# Patient Record
Sex: Male | Born: 1935 | Race: White | Hispanic: No | Marital: Married | Smoking: Former smoker
Health system: Southern US, Community
[De-identification: ages and names within clinical notes are randomized; demographics above are authoritative.]

## PROBLEM LIST (undated history)

## (undated) DIAGNOSIS — R609 Edema, unspecified: Secondary | ICD-10-CM

## (undated) DIAGNOSIS — J449 Chronic obstructive pulmonary disease, unspecified: Secondary | ICD-10-CM

## (undated) DIAGNOSIS — R011 Cardiac murmur, unspecified: Secondary | ICD-10-CM

## (undated) DIAGNOSIS — I10 Essential (primary) hypertension: Secondary | ICD-10-CM

## (undated) DIAGNOSIS — Z8673 Personal history of transient ischemic attack (TIA), and cerebral infarction without residual deficits: Secondary | ICD-10-CM

## (undated) DIAGNOSIS — I252 Old myocardial infarction: Secondary | ICD-10-CM

## (undated) DIAGNOSIS — IMO0002 Reserved for concepts with insufficient information to code with codable children: Secondary | ICD-10-CM

## (undated) DIAGNOSIS — E785 Hyperlipidemia, unspecified: Secondary | ICD-10-CM

## (undated) DIAGNOSIS — I2510 Atherosclerotic heart disease of native coronary artery without angina pectoris: Secondary | ICD-10-CM

## (undated) DIAGNOSIS — IMO0001 Reserved for inherently not codable concepts without codable children: Secondary | ICD-10-CM

## (undated) HISTORY — PX: HERNIA REPAIR: SHX51

## (undated) HISTORY — PX: VASECTOMY: SHX75

## (undated) HISTORY — PX: CORONARY ANGIOPLASTY WITH STENT PLACEMENT: SHX49

---

## 1999-03-16 DIAGNOSIS — I252 Old myocardial infarction: Secondary | ICD-10-CM

## 1999-03-16 HISTORY — DX: Old myocardial infarction: I25.2

## 2005-05-10 ENCOUNTER — Ambulatory Visit: Payer: Self-pay | Admitting: Cardiology

## 2005-06-15 ENCOUNTER — Ambulatory Visit: Payer: Self-pay | Admitting: Family Medicine

## 2006-02-04 ENCOUNTER — Other Ambulatory Visit: Payer: Self-pay

## 2006-02-04 ENCOUNTER — Inpatient Hospital Stay: Payer: Self-pay | Admitting: Internal Medicine

## 2006-10-14 ENCOUNTER — Ambulatory Visit: Payer: Self-pay | Admitting: Unknown Physician Specialty

## 2010-02-16 ENCOUNTER — Ambulatory Visit: Payer: Self-pay | Admitting: Unknown Physician Specialty

## 2010-06-27 ENCOUNTER — Ambulatory Visit: Payer: Self-pay | Admitting: Internal Medicine

## 2012-01-12 ENCOUNTER — Ambulatory Visit: Payer: Self-pay | Admitting: Internal Medicine

## 2012-02-03 ENCOUNTER — Ambulatory Visit: Payer: Self-pay | Admitting: Internal Medicine

## 2012-08-16 ENCOUNTER — Ambulatory Visit: Payer: Self-pay | Admitting: Neurology

## 2012-08-22 ENCOUNTER — Ambulatory Visit: Payer: Self-pay | Admitting: Cardiology

## 2013-10-29 ENCOUNTER — Ambulatory Visit: Payer: Self-pay | Admitting: Surgery

## 2013-10-29 DIAGNOSIS — I2510 Atherosclerotic heart disease of native coronary artery without angina pectoris: Secondary | ICD-10-CM

## 2013-10-29 LAB — CBC WITH DIFFERENTIAL/PLATELET
BASOS ABS: 0 10*3/uL (ref 0.0–0.1)
Basophil %: 0.5 %
Eosinophil #: 0.1 10*3/uL (ref 0.0–0.7)
Eosinophil %: 2 %
HCT: 46.5 % (ref 40.0–52.0)
HGB: 15 g/dL (ref 13.0–18.0)
LYMPHS ABS: 1.2 10*3/uL (ref 1.0–3.6)
LYMPHS PCT: 17.3 %
MCH: 31.9 pg (ref 26.0–34.0)
MCHC: 32.3 g/dL (ref 32.0–36.0)
MCV: 99 fL (ref 80–100)
Monocyte #: 0.6 x10 3/mm (ref 0.2–1.0)
Monocyte %: 8.5 %
Neutrophil #: 4.9 10*3/uL (ref 1.4–6.5)
Neutrophil %: 71.7 %
Platelet: 195 10*3/uL (ref 150–440)
RBC: 4.72 10*6/uL (ref 4.40–5.90)
RDW: 12.9 % (ref 11.5–14.5)
WBC: 6.9 10*3/uL (ref 3.8–10.6)

## 2013-10-29 LAB — BASIC METABOLIC PANEL
ANION GAP: 10 (ref 7–16)
BUN: 15 mg/dL (ref 7–18)
CHLORIDE: 102 mmol/L (ref 98–107)
Calcium, Total: 9.2 mg/dL (ref 8.5–10.1)
Co2: 31 mmol/L (ref 21–32)
Creatinine: 0.99 mg/dL (ref 0.60–1.30)
Glucose: 95 mg/dL (ref 65–99)
Osmolality: 286 (ref 275–301)
POTASSIUM: 3.8 mmol/L (ref 3.5–5.1)
Sodium: 143 mmol/L (ref 136–145)

## 2013-11-05 ENCOUNTER — Ambulatory Visit: Payer: Self-pay | Admitting: Surgery

## 2014-07-06 NOTE — Op Note (Signed)
PATIENT NAME:  Hunter Carey, Hunter Carey MR#:  [id number] DATE OF BIRTH:  [birth year]  DATE OF PROCEDURE:  11/05/2013  PREOPERATIVE DIAGNOSIS: Right inguinal hernia.   POSTOPERATIVE DIAGNOSIS: Right inguinal hernia.  OPERATION: Robotic-assisted right laparoscopic inguinal hernia repair.   ANESTHESIA: General.   SURGEON: Rodena Goldmann III, MD  OPERATIVE PROCEDURE: With the patient in the supine position, after induction of appropriate general anesthesia, the patient's abdomen was prepped with ChloraPrep and draped with sterile towels. The patient was placed in the head down, feet up position. A small supraumbilical incision was made in the standard fashion, carried down bluntly through subcutaneous tissue. A Veress needle was used to cannulate the peritoneal cavity. CO2 was insufflated to appropriate pressure measurements. When approximately 2.5 liters of CO2 were instilled, the Veress needle was withdrawn, and an 8.5 mm robotic port inserted in the supraumbilical area. Two lateral ports, 8 mm in size, were inserted under direct vision.   A right upper quadrant transverse incision was made and a 10 mm assistant port inserted under direct vision. The defect appeared to be a right direct hernia. The left side did not appear to be involved. The abdomen was briefly surveyed and no other evidence of injury or pathology was identified. The robot was brought to the table, appropriately docked in position. Instruments inserted under direct vision. I then moved to the console. The peritoneum was taken down from the medial umbilical fold laterally across the epigastric vessels. A large pocket was created in the preperitoneal space. Transversalis fascia was dissected free from the defect. The defect was quite small, so I elected to close primarily using V-Loc suture. Bard 3DMax mesh was brought to the table using a medium size piece, inserted in the preperitoneal space. It was sutured in place using 3-0 Vicryl. The peritoneum  was then placed back over the mesh, reapproximated using V-Loc suture. The repair appeared to be satisfactory. All ports were withdrawn without difficulty. The skin incisions were closed with 5-0 nylon. The area was infiltrated with 0.25% Marcaine for postoperative pain control. Sterile dressings were applied. The patient was returned to the recovery room, having tolerated the procedure well. Sponge and instrument count were correct x 2 in the operating room.     ____________________________ Micheline Maze, MD rle:LT D: 11/05/2013 11:42:00 ET T: 11/05/2013 17:58:51 ET JOB#: [id number]  cc: Micheline Maze, MD, <Dictator> Christena Flake. Raechel Ache, MD Rodena Goldmann MD ELECTRONICALLY SIGNED 11/08/2013 17:28

## 2014-11-23 ENCOUNTER — Observation Stay
Admission: EM | Admit: 2014-11-23 | Discharge: 2014-11-24 | Disposition: A | Discharge status: Home / Self Care | Payer: Medicare Other | Attending: Internal Medicine | Admitting: Internal Medicine

## 2014-11-23 ENCOUNTER — Emergency Department: Payer: Medicare Other

## 2014-11-23 DIAGNOSIS — Z87891 Personal history of nicotine dependence: Secondary | ICD-10-CM | POA: Insufficient documentation

## 2014-11-23 DIAGNOSIS — Z9852 Vasectomy status: Secondary | ICD-10-CM | POA: Diagnosis not present

## 2014-11-23 DIAGNOSIS — Z8673 Personal history of transient ischemic attack (TIA), and cerebral infarction without residual deficits: Secondary | ICD-10-CM | POA: Insufficient documentation

## 2014-11-23 DIAGNOSIS — E785 Hyperlipidemia, unspecified: Secondary | ICD-10-CM | POA: Insufficient documentation

## 2014-11-23 DIAGNOSIS — Z955 Presence of coronary angioplasty implant and graft: Secondary | ICD-10-CM | POA: Insufficient documentation

## 2014-11-23 DIAGNOSIS — Z9889 Other specified postprocedural states: Secondary | ICD-10-CM | POA: Diagnosis not present

## 2014-11-23 DIAGNOSIS — I10 Essential (primary) hypertension: Secondary | ICD-10-CM | POA: Insufficient documentation

## 2014-11-23 DIAGNOSIS — I252 Old myocardial infarction: Secondary | ICD-10-CM | POA: Diagnosis not present

## 2014-11-23 DIAGNOSIS — I2510 Atherosclerotic heart disease of native coronary artery without angina pectoris: Secondary | ICD-10-CM | POA: Diagnosis not present

## 2014-11-23 DIAGNOSIS — R079 Chest pain, unspecified: Secondary | ICD-10-CM | POA: Diagnosis not present

## 2014-11-23 DIAGNOSIS — J449 Chronic obstructive pulmonary disease, unspecified: Secondary | ICD-10-CM | POA: Insufficient documentation

## 2014-11-23 DIAGNOSIS — Z7982 Long term (current) use of aspirin: Secondary | ICD-10-CM | POA: Diagnosis not present

## 2014-11-23 HISTORY — DX: Old myocardial infarction: I25.2

## 2014-11-23 HISTORY — DX: Chronic obstructive pulmonary disease, unspecified: J44.9

## 2014-11-23 HISTORY — DX: Personal history of transient ischemic attack (TIA), and cerebral infarction without residual deficits: Z86.73

## 2014-11-23 HISTORY — DX: Atherosclerotic heart disease of native coronary artery without angina pectoris: I25.10

## 2014-11-23 HISTORY — DX: Reserved for concepts with insufficient information to code with codable children: IMO0002

## 2014-11-23 HISTORY — DX: Hyperlipidemia, unspecified: E78.5

## 2014-11-23 HISTORY — DX: Essential (primary) hypertension: I10

## 2014-11-23 LAB — TROPONIN I
Troponin I: <0.03 ng/mL (ref <0.031)
Troponin I: <0.03 ng/mL (ref <0.031)
Troponin I: <0.03 ng/mL (ref <0.031)

## 2014-11-23 LAB — CBC
HCT: 47.2 % (ref 40.0–52.0)
Hemoglobin: 15.8 g/dL (ref 13.0–18.0)
MCH: 32.1 pg (ref 26.0–34.0)
MCHC: 33.4 g/dL (ref 32.0–36.0)
MCV: 96.1 fL (ref 80.0–100.0)
PLATELETS: 176 10*3/uL (ref 150–440)
RBC: 4.92 MIL/uL (ref 4.40–5.90)
RDW: 13 % (ref 11.5–14.5)
WBC: 5.6 10*3/uL (ref 3.8–10.6)

## 2014-11-23 LAB — LIPASE, BLOOD: Lipase: 41 U/L (ref 22–51)

## 2014-11-23 LAB — TSH: TSH: 1.045 u[IU]/mL (ref 0.350–4.500)

## 2014-11-23 LAB — BASIC METABOLIC PANEL
Anion gap: 9 (ref 5–15)
BUN: 17 mg/dL (ref 6–20)
CHLORIDE: 101 mmol/L (ref 101–111)
CO2: 30 mmol/L (ref 22–32)
CREATININE: 0.94 mg/dL (ref 0.61–1.24)
Calcium: 10 mg/dL (ref 8.9–10.3)
GFR calc Af Amer: >60 mL/min (ref >60)
GFR calc non Af Amer: >60 mL/min (ref >60)
GLUCOSE: 105 mg/dL — AB (ref 65–99)
Potassium: 3.6 mmol/L (ref 3.5–5.1)
SODIUM: 140 mmol/L (ref 135–145)

## 2014-11-23 MED ORDER — MAGNESIUM OXIDE 400 (241.3 MG) MG PO TABS
400.0000 mg | ORAL_TABLET | Freq: Two times a day (BID) | ORAL | Status: DC
  Filled 2014-11-23: qty 1

## 2014-11-23 MED ORDER — SIMVASTATIN 10 MG PO TABS
10.0000 mg | ORAL_TABLET | Freq: Every day | ORAL | Status: DC
  Administered 2014-11-23: 10 mg via ORAL
  Filled 2014-11-23: qty 1

## 2014-11-23 MED ORDER — HYDROCHLOROTHIAZIDE 25 MG PO TABS
12.5000 mg | ORAL_TABLET | ORAL | Status: DC
  Administered 2014-11-24: 12.5 mg via ORAL
  Filled 2014-11-23: qty 1

## 2014-11-23 MED ORDER — NITROGLYCERIN 0.4 MG SL SUBL: 0.4000 mg | SUBLINGUAL_TABLET | SUBLINGUAL | Status: DC | PRN

## 2014-11-23 MED ORDER — METOPROLOL TARTRATE 25 MG PO TABS
12.5000 mg | ORAL_TABLET | Freq: Two times a day (BID) | ORAL | Status: DC
  Administered 2014-11-23 – 2014-11-24 (×2): 12.5 mg via ORAL
  Filled 2014-11-23 (×2): qty 1

## 2014-11-23 MED ORDER — RAMIPRIL 10 MG PO CAPS
10.0000 mg | ORAL_CAPSULE | Freq: Two times a day (BID) | ORAL | Status: DC
  Administered 2014-11-23 – 2014-11-24 (×2): 10 mg via ORAL
  Filled 2014-11-23 (×2): qty 1

## 2014-11-23 MED ORDER — ASPIRIN 81 MG PO CHEW
324.0000 mg | CHEWABLE_TABLET | Freq: Once | ORAL | Status: AC
  Administered 2014-11-23: 243 mg via ORAL
  Filled 2014-11-23: qty 4

## 2014-11-23 MED ORDER — ASPIRIN EC 81 MG PO TBEC
81.0000 mg | DELAYED_RELEASE_TABLET | Freq: Every day | ORAL | Status: DC
  Administered 2014-11-24: 81 mg via ORAL
  Filled 2014-11-23: qty 1

## 2014-11-23 MED ORDER — ONDANSETRON HCL 4 MG/2ML IJ SOLN: 4.0000 mg | Freq: Four times a day (QID) | INTRAMUSCULAR | Status: DC | PRN

## 2014-11-23 MED ORDER — ENOXAPARIN SODIUM 40 MG/0.4ML ~~LOC~~ SOLN
40.0000 mg | SUBCUTANEOUS | Status: DC
  Administered 2014-11-23: 40 mg via SUBCUTANEOUS
  Filled 2014-11-23: qty 0.4

## 2014-11-23 MED ORDER — ONDANSETRON HCL 4 MG PO TABS
4.0000 mg | ORAL_TABLET | Freq: Four times a day (QID) | ORAL | Status: DC | PRN
Start: 2014-11-23 — End: 2014-11-24

## 2014-11-23 MED ORDER — ACETAMINOPHEN 650 MG RE SUPP: 650.0000 mg | Freq: Four times a day (QID) | RECTAL | Status: DC | PRN

## 2014-11-23 MED ORDER — ACETAMINOPHEN 325 MG PO TABS: 650.0000 mg | ORAL_TABLET | Freq: Four times a day (QID) | ORAL | Status: DC | PRN

## 2014-11-23 NOTE — ED Notes (Signed)
Patient resting comfortably. Wife at bedside. Aspirin given. Denies any pain at this time.

## 2014-11-23 NOTE — H&P (Signed)
[facility] NAME: Hunter Carey    MR#:  [id number]  DATE OF BIRTH:  [birth year]  DATE OF ADMISSION:  11/23/2014  PRIMARY CARE PHYSICIAN:  Dr. Raechel Ache  REQUESTING/REFERRING PHYSICIAN: Delman Kitten, M.D  CHIEF COMPLAINT:   Chief Complaint  Patient presents with  . Chest Pain    HISTORY OF PRESENT ILLNESS: Hunter Carey  is a 79 y.o. male with a known history of CAD and MI with stent placement who presents with the emergency room with complaint of having chest pain since yesterday. Patient reports that he started having left-sided discomfort similar to his heart attack in 2001. It's been coming and going. Sometimes it'll last for an hour sometimes 4 minutes. He did not have any shortness of breath no radiation of the pain. Denies any shortness of breath no nausea or vomiting. The emergency room physician did speak to on call cardiologist who recommended patient be observed overnight.  PAST MEDICAL HISTORY:   Past Medical History  Diagnosis Date  . Hypertension   . Hyperlipemia   . H/O TIA (transient ischemic attack) and stroke   . COPD (chronic obstructive pulmonary disease)   . CAD (coronary artery disease)   . MI, old 2001    lad stent  . Compression fracture h/o    PAST SURGICAL HISTORY:  Past Surgical History  Procedure Laterality Date  . Coronary angioplasty with stent placement    . Hernia repair    . Vasectomy      SOCIAL HISTORY:  Social History  Substance Use Topics  . Smoking status: Former Research scientist (life sciences)  . Smokeless tobacco: Never Used  . Alcohol Use: 0.6 oz/week    1 Standard drinks or equivalent per week    FAMILY HISTORY:  Family History  Problem Relation Age of Onset  . Hypertension Mother     DRUG ALLERGIES: No Known Allergies  REVIEW OF SYSTEMS:   CONSTITUTIONAL: No fever, fatigue or weakness.  EYES: No blurred or double vision.  EARS, NOSE, AND THROAT: No tinnitus or ear pain.  RESPIRATORY:  No cough, shortness of breath, wheezing or hemoptysis.  CARDIOVASCULAR: Positive chest pain, orthopnea, edema.  GASTROINTESTINAL: No nausea, vomiting, diarrhea or abdominal pain.  GENITOURINARY: No dysuria, hematuria.  ENDOCRINE: No polyuria, nocturia,  HEMATOLOGY: No anemia, easy bruising or bleeding SKIN: No rash or lesion. MUSCULOSKELETAL: No joint pain or arthritis.   NEUROLOGIC: No tingling, numbness, weakness.  PSYCHIATRY: No anxiety or depression.   MEDICATIONS AT HOME:  Prior to Admission medications   Medication Sig Start Date End Date Taking? Authorizing Provider  aspirin EC 81 MG tablet Take 81 mg by mouth daily.   Yes Historical Provider, MD  hydrochlorothiazide (HYDRODIURIL) 25 MG tablet Take 12.5 mg by mouth every morning. 11/22/14  Yes Historical Provider, MD  magnesium oxide (MAG-OX) 400 (241.3 MG) MG tablet Take 400 mg by mouth daily as needed. For cramps. 08/21/14  Yes Historical Provider, MD  metoprolol tartrate (LOPRESSOR) 25 MG tablet Take 12.5 mg by mouth 2 (two) times daily. 11/22/14  Yes Historical Provider, MD  nitroGLYCERIN (NITROSTAT) 0.4 MG SL tablet Place 0.4 mg under the tongue every 5 (five) minutes x 3 doses as needed. For chest pain. If no relief call MD or go to emergency room.   Yes Historical Provider, MD  ramipril (ALTACE) 10 MG capsule Take 10 mg by mouth 2 (two) times daily. 11/22/14  Yes Historical Provider, MD  simvastatin (ZOCOR) 10 MG tablet  Take 10 mg by mouth at bedtime. 11/22/14  Yes Historical Provider, MD      PHYSICAL EXAMINATION:   VITAL SIGNS: Blood pressure 124/67, pulse 43, temperature 97.6 F (36.4 C), temperature source Oral, resp. rate 18, height 5\' 8"  (1.727 m), weight 66.679 kg (147 lb), SpO2 96 %.  GENERAL:  79 y.o.-year-old patient lying in the bed with no acute distress.  EYES: Pupils equal, round, reactive to light and accommodation. No scleral icterus. Extraocular muscles intact.  HEENT: Head atraumatic, normocephalic. Oropharynx and  nasopharynx clear.  NECK:  Supple, no jugular venous distention. No thyroid enlargement, no tenderness.  LUNGS: Normal breath sounds bilaterally, no wheezing, rales,rhonchi or crepitation. No use of accessory muscles of respiration.  CARDIOVASCULAR: S1, S2 normal. No murmurs, rubs, or gallops.  ABDOMEN: Soft, nontender, nondistended. Bowel sounds present. No organomegaly or mass.  EXTREMITIES: No pedal edema, cyanosis, or clubbing.  NEUROLOGIC: Cranial nerves II through XII are intact. Muscle strength 5/5 in all extremities. Sensation intact. Gait not checked.  PSYCHIATRIC: The patient is alert and oriented x 3.  SKIN: No obvious rash, lesion, or ulcer.   LABORATORY PANEL:   CBC  Recent Labs Lab 11/23/14 1042  WBC 5.6  HGB 15.8  HCT 47.2  PLT 176  MCV 96.1  MCH 32.1  MCHC 33.4  RDW 13.0   ------------------------------------------------------------------------------------------------------------------  Chemistries   Recent Labs Lab 11/23/14 1042  NA 140  K 3.6  CL 101  CO2 30  GLUCOSE 105*  BUN 17  CREATININE 0.94  CALCIUM 10.0   ------------------------------------------------------------------------------------------------------------------ estimated creatinine clearance is 63.1 mL/min (by C-G formula based on Cr of 0.94). ------------------------------------------------------------------------------------------------------------------ No results for input(s): TSH, T4TOTAL, T3FREE, THYROIDAB in the last 72 hours.  Invalid input(s): FREET3   Coagulation profile No results for input(s): INR, PROTIME in the last 168 hours. ------------------------------------------------------------------------------------------------------------------- No results for input(s): DDIMER in the last 72 hours. -------------------------------------------------------------------------------------------------------------------  Cardiac Enzymes  Recent Labs Lab 11/23/14 1042   TROPONINI <0.03   ------------------------------------------------------------------------------------------------------------------ Invalid input(s): POCBNP  ---------------------------------------------------------------------------------------------------------------  Urinalysis No results found for: COLORURINE, APPEARANCEUR, LABSPEC, PHURINE, GLUCOSEU, HGBUR, BILIRUBINUR, KETONESUR, PROTEINUR, UROBILINOGEN, NITRITE, LEUKOCYTESUR   RADIOLOGY: Dg Chest 2 View  11/23/2014   CLINICAL DATA:  Discomfort in the chest since last night.  EXAM: CHEST  2 VIEW  COMPARISON:  Chest CT 02/04/2006. Prior chest radiographs on available for review.  FINDINGS: Chronic hyperinflation and mild bronchitic markings. Minimal atelectatic or scar like opacities at the bases. There is no edema, consolidation, effusion, or pneumothorax. Normal heart size and negative aortic contours.  IMPRESSION: COPD changes without acute superimposed finding.   Electronically Signed   By: Monte Fantasia M.D.   On: 11/23/2014 10:41    EKG: Orders placed or performed during the hospital encounter of 11/23/14  . EKG 12-Lead  . EKG 12-Lead  . ED EKG within 10 minutes  . ED EKG within 10 minutes    IMPRESSION AND PLAN: Patient is a 79 year old white male with chest pain 1. Chest pain: At this time will monitor him overnight for observation. Serial cardiac enzymes continue aspirin. Have cardiology evaluate the patient.  2. Hypertension continue hydrochlorothiazide and metoprolol and ramimpril  3, hyperlipidemia continue simvastatin check a fasting lipid panel tomorrow morning  4, miscellaneous we'll do Lovenox for DVT prophylaxis    All the records are reviewed and case discussed with ED provider. Management plans discussed with the patient, family and they are in agreement.  CODE STATUS:    TOTAL TIME TAKING  CARE OF THIS PATIENT: 22min .    Dustin Flock M.D on 11/23/2014 at 11:57 AM  Between 7am to 6pm -  Pager - [phone]  After 6pm go to [email or web address] - password [email or web address] [facility]  [facility]  Office  [phone]  CC: Primary care physician; No primary care provider on file.

## 2014-11-23 NOTE — ED Provider Notes (Signed)
[facility] Emergency Department Provider Note REMINDER - THIS NOTE IS NOT A FINAL MEDICAL RECORD UNTIL IT IS SIGNED. UNTIL THEN, THE CONTENT BELOW MAY REFLECT INFORMATION FROM A DOCUMENTATION TEMPLATE, NOT THE ACTUAL PATIENT VISIT. ____________________________________________  Time seen: Approximately 10:23 AM  I have reviewed the triage vital signs and the nursing notes.   HISTORY  Chief Complaint Chest Pain    HPI Hunter Carey is a 79 y.o. male who reports having discomfort in the left chest which she describes as somewhat hard to describe that feels like a pressure over the left side of the chest. He reports he comes today because this feels just like the symptoms he had prior to his heart attack 15 years ago.  Patient does have a history of coronary artery disease and stents. Is currently on aspirin. He is under the care Dr. Josefa Half.  Denies abdominal pain. No trouble breathing. Fevers or chills. Pain is nonradiating.No notable change with exertion Past Medical History  Diagnosis Date  . Hypertension   . Hyperlipemia     There are no active problems to display for this patient.   Past Surgical History  Procedure Laterality Date  . Coronary angioplasty with stent placement    . Hernia repair      Current Outpatient Rx  Name  Route  Sig  Dispense  Refill  . aspirin EC 81 MG tablet   Oral   Take 81 mg by mouth daily.         . hydrochlorothiazide (HYDRODIURIL) 25 MG tablet   Oral   Take 12.5 mg by mouth every morning.         . magnesium oxide (MAG-OX) 400 (241.3 MG) MG tablet   Oral   Take 400 mg by mouth daily as needed. For cramps.      3   . metoprolol tartrate (LOPRESSOR) 25 MG tablet   Oral   Take 12.5 mg by mouth 2 (two) times daily.         . nitroGLYCERIN (NITROSTAT) 0.4 MG SL tablet   Sublingual   Place 0.4 mg under the tongue every 5 (five) minutes x 3 doses as needed. For chest pain. If no relief call MD or go  to emergency room.         . ramipril (ALTACE) 10 MG capsule   Oral   Take 10 mg by mouth 2 (two) times daily.         . simvastatin (ZOCOR) 10 MG tablet   Oral   Take 10 mg by mouth at bedtime.           Allergies Review of patient's allergies indicates no known allergies.  No family history on file.  Social History Social History  Substance Use Topics  . Smoking status: Former Research scientist (life sciences)  . Smokeless tobacco: Never Used  . Alcohol Use: Yes   strong family history coronary disease  Review of Systems Constitutional: No fever/chills Eyes: No visual changes. ENT: No sore throat. Cardiovascular: See history of present illness Respiratory: Denies shortness of breath. Gastrointestinal: No abdominal pain.  No nausea, no vomiting.  No diarrhea.  No constipation. Genitourinary: Negative for dysuria. Musculoskeletal: Negative for back pain. Skin: Negative for rash. Neurological: Negative for headaches, focal weakness or numbness.  10-point ROS otherwise negative.  ____________________________________________   PHYSICAL EXAM:  VITAL SIGNS: ED Triage Vitals  Enc Vitals Group     BP 11/23/14 0953 134/60 mmHg     Pulse Rate 11/23/14  0953 48     Resp 11/23/14 0953 16     Temp 11/23/14 0953 97.6 F (36.4 C)     Temp Source 11/23/14 0953 Oral     SpO2 11/23/14 0953 97 %     Weight 11/23/14 0953 147 lb (66.679 kg)     Height 11/23/14 0953 5\' 8"  (1.727 m)     Head Cir --      Peak Flow --      Pain Score 11/23/14 0954 1     Pain Loc --      Pain Edu? --      Excl. in [place]? --    Constitutional: Alert and oriented. Well appearing and in no acute distress. Eyes: Conjunctivae are normal. PERRL. EOMI. Head: Atraumatic. Nose: No congestion/rhinnorhea. Mouth/Throat: Mucous membranes are moist.  Oropharynx non-erythematous. Neck: No stridor.   Cardiovascular: Normal rate, regular rhythm. Grossly normal heart sounds.  Good peripheral circulation. Respiratory: Normal  respiratory effort.  No retractions. Lungs CTAB. Gastrointestinal: Soft and nontender. No distention. No abdominal bruits. No CVA tenderness. Musculoskeletal: No lower extremity tenderness nor edema.  No joint effusions. Neurologic:  Normal speech and language. No gross focal neurologic deficits are appreciated.  Skin:  Skin is warm, dry and intact. No rash noted. Psychiatric: Mood and affect are normal. Speech and behavior are normal.  ____________________________________________   LABS (all labs ordered are listed, but only abnormal results are displayed)  Labs Reviewed  BASIC METABOLIC PANEL - Abnormal; Notable for the following:    Glucose, Bld 105 (*)    All other components within normal limits  CBC  TROPONIN I  LIPASE, BLOOD   ____________________________________________  EKG  Reviewed and interpreted by me EKG time 9:53 AM Normal sinus rhythm There is T-wave inversion noted in V2 which is new compared with previous from 10/29/2013 Is no ST elevation Normal intervals Reviewed and interpreted as normal sinus rhythm but with potentially concerning EKG changes noted in V2 only without ST elevation. ____________________________________________  JOACZYSAY  DG Chest 2 View (Final result) Result time: 11/23/14 10:41:36   Final result by Rad Results In Interface (11/23/14 10:41:36)   Narrative:   CLINICAL DATA: Discomfort in the chest since last night.  EXAM: CHEST 2 VIEW  COMPARISON: Chest CT 02/04/2006. Prior chest radiographs on available for review.  FINDINGS: Chronic hyperinflation and mild bronchitic markings. Minimal atelectatic or scar like opacities at the bases. There is no edema, consolidation, effusion, or pneumothorax. Normal heart size and negative aortic contours.  IMPRESSION: COPD changes without acute superimposed finding.    ____________________________________________   PROCEDURES  Procedure(s) performed: None  Critical Care  performed: No  ____________________________________________   INITIAL IMPRESSION / ASSESSMENT AND PLAN / ED COURSE  Pertinent labs & imaging results that were available during my care of the patient were reviewed by me and considered in my medical decision making (see chart for details).  Chest pain. History of previous MI with similar symptoms. No abdominal pain. Symptoms and EKG changes concerning for the possibility of acute coronary syndrome, no evidence of acute ST elevation MI. Sent troponin, chest x-ray and standard chest pain evaluation. Anticipate admission.  ----------------------------------------- 11:40 AM on 11/23/2014 -----------------------------------------  Discussed case and care with Dr. Ubaldo Glassing. I also discussed with the patient, the patient has agreed with plan for overnight observation regarding ongoing chest discomfort. Thus far no evidence of acute coronary syndrome based on biomarkers. ____________________________________________   FINAL CLINICAL IMPRESSION(S) / ED DIAGNOSES  Final diagnoses:  Chest  pain, moderate coronary artery risk      Delman Kitten, MD 11/23/14 1140

## 2014-11-23 NOTE — ED Notes (Signed)
Pt c/o substernal to left sided chest pain/epigastric pain that started yesterday around 12pm with SOB.Marland Kitchenstates it feels the same as last MI.Marland Kitchen

## 2014-11-24 LAB — LIPID PANEL
Cholesterol: 114 mg/dL (ref 0–200)
HDL: 45 mg/dL (ref >40)
LDL CALC: 59 mg/dL (ref 0–99)
Total CHOL/HDL Ratio: 2.5 RATIO
Triglycerides: 50 mg/dL (ref <150)
VLDL: 10 mg/dL (ref 0–40)

## 2014-11-24 LAB — TROPONIN I

## 2014-11-24 NOTE — Discharge Summary (Signed)
[facility] NAME: Hunter Carey    MR#:  [id number]  DATE OF BIRTH:  Jan 15, 1936  DATE OF ADMISSION:  11/23/2014 ADMITTING PHYSICIAN: Dustin Flock, MD  DATE OF DISCHARGE: 11/24/2014  PRIMARY CARE PHYSICIAN: No primary care provider on file.    ADMISSION DIAGNOSIS:  Chest pain, moderate coronary artery risk [R07.9]  DISCHARGE DIAGNOSIS:  Active Problems:   Chest pain   Seen by cardiologist in hospital, troponin negative.  SECONDARY DIAGNOSIS:   Past Medical History  Diagnosis Date  . Hypertension   . Hyperlipemia   . H/O TIA (transient ischemic attack) and stroke   . COPD (chronic obstructive pulmonary disease)   . CAD (coronary artery disease)   . MI, old 2001    lad stent  . Compression fracture h/o    HOSPITAL COURSE:  Patient is a 79 year old white male with chest pain 1. Chest pain: telemetry monitor overnight for observation. Serial cardiac enzymes negative,continue aspirin. Cardiologist Dr. Ubaldo Glassing seen the pt in hospital, follow in office with Dr. Saralyn Pilar in 1 week.  2. Hypertension continue hydrochlorothiazide and metoprolol and ramimpril  3, hyperlipidemia continue simvastatin check a fasting lipid panel tomorrow morning  4, miscellaneous we'll do Lovenox for DVT prophylaxis  DISCHARGE CONDITIONS:   Stable.  CONSULTS OBTAINED:  Treatment Team:  Dustin Flock, MD Teodoro Spray, MD  DRUG ALLERGIES:  No Known Allergies  DISCHARGE MEDICATIONS:   Current Discharge Medication List    CONTINUE these medications which have NOT CHANGED   Details  aspirin EC 81 MG tablet Take 81 mg by mouth daily.    hydrochlorothiazide (HYDRODIURIL) 25 MG tablet Take 12.5 mg by mouth every morning.    magnesium oxide (MAG-OX) 400 (241.3 MG) MG tablet Take 400 mg by mouth daily as needed. For cramps. Refills: 3    metoprolol tartrate (LOPRESSOR) 25 MG tablet Take 12.5 mg by mouth 2 (two) times daily.     nitroGLYCERIN (NITROSTAT) 0.4 MG SL tablet Place 0.4 mg under the tongue every 5 (five) minutes x 3 doses as needed. For chest pain. If no relief call MD or go to emergency room.    ramipril (ALTACE) 10 MG capsule Take 10 mg by mouth 2 (two) times daily.    simvastatin (ZOCOR) 10 MG tablet Take 10 mg by mouth at bedtime.         DISCHARGE INSTRUCTIONS:    Follow in cardiology office in 1 week.  If you experience worsening of your admission symptoms, develop shortness of breath, life threatening emergency, suicidal or homicidal thoughts you must seek medical attention immediately by calling 911 or calling your MD immediately  if symptoms less severe.  You Must read complete instructions/literature along with all the possible adverse reactions/side effects for all the Medicines you take and that have been prescribed to you. Take any new Medicines after you have completely understood and accept all the possible adverse reactions/side effects.   Please note  You were cared for by a hospitalist during your hospital stay. If you have any questions about your discharge medications or the care you received while you were in the hospital after you are discharged, you can call the unit and asked to speak with the hospitalist on call if the hospitalist that took care of you is not available. Once you are discharged, your primary care physician will handle any further medical issues. Please note that NO REFILLS for any discharge medications will be authorized  once you are discharged, as it is imperative that you return to your primary care physician (or establish a relationship with a primary care physician if you do not have one) for your aftercare needs so that they can reassess your need for medications and monitor your lab values.    Today   CHIEF COMPLAINT:   Chief Complaint  Patient presents with  . Chest Pain    HISTORY OF PRESENT ILLNESS:  Hunter Carey  is a 79 y.o. male with a known  history of CAD and MI with stent placement who presents with the emergency room with complaint of having chest pain since yesterday. Patient reports that he started having left-sided discomfort similar to his heart attack in 2001. It's been coming and going. Sometimes it'll last for an hour sometimes 4 minutes. He did not have any shortness of breath no radiation of the pain. Denies any shortness of breath no nausea or vomiting. The emergency room physician did speak to on call cardiologist who recommended patient be observed overnight.   VITAL SIGNS:  Blood pressure 117/61, pulse 50, temperature 97.7 F (36.5 C), temperature source Oral, resp. rate 18, height 5\' 8"  (1.727 m), weight 66.679 kg (147 lb), SpO2 96 %.  I/O:   Intake/Output Summary (Last 24 hours) at 11/24/14 1052 Last data filed at 11/24/14 0925  Gross per 24 hour  Intake      0 ml  Output   1200 ml  Net  -1200 ml    PHYSICAL EXAMINATION:   GENERAL: 79 y.o.-year-old patient lying in the bed with no acute distress.  EYES: Pupils equal, round, reactive to light and accommodation. No scleral icterus. Extraocular muscles intact.  HEENT: Head atraumatic, normocephalic. Oropharynx and nasopharynx clear.  NECK: Supple, no jugular venous distention. No thyroid enlargement, no tenderness.  LUNGS: Normal breath sounds bilaterally, no wheezing, rales,rhonchi or crepitation. No use of accessory muscles of respiration.  CARDIOVASCULAR: S1, S2 normal. No murmurs, rubs, or gallops.  ABDOMEN: Soft, nontender, nondistended. Bowel sounds present. No organomegaly or mass.  EXTREMITIES: No pedal edema, cyanosis, or clubbing.  NEUROLOGIC: Cranial nerves II through XII are intact. Muscle strength 5/5 in all extremities. Sensation intact. Gait not checked.  PSYCHIATRIC: The patient is alert and oriented x 3.  SKIN: No obvious rash, lesion, or ulcer.   DATA REVIEW:   CBC  Recent Labs Lab 11/23/14 1042  WBC 5.6  HGB 15.8  HCT  47.2  PLT 176    Chemistries   Recent Labs Lab 11/23/14 1042  NA 140  K 3.6  CL 101  CO2 30  GLUCOSE 105*  BUN 17  CREATININE 0.94  CALCIUM 10.0    Cardiac Enzymes  Recent Labs Lab 11/24/14 0304  TROPONINI <0.03    Microbiology Results  No results found for this or any previous visit.  RADIOLOGY:  Dg Chest 2 View  11/23/2014   CLINICAL DATA:  Discomfort in the chest since last night.  EXAM: CHEST  2 VIEW  COMPARISON:  Chest CT 02/04/2006. Prior chest radiographs on available for review.  FINDINGS: Chronic hyperinflation and mild bronchitic markings. Minimal atelectatic or scar like opacities at the bases. There is no edema, consolidation, effusion, or pneumothorax. Normal heart size and negative aortic contours.  IMPRESSION: COPD changes without acute superimposed finding.   Electronically Signed   By: Monte Fantasia M.D.   On: 11/23/2014 10:41     Management plans discussed with the patient, family and they are in agreement.  CODE STATUS:     Code Status Orders        Start     Ordered   11/23/14 1258  Full code   Continuous     11/23/14 1257      TOTAL TIME TAKING CARE OF THIS PATIENT: 35 minutes.    Vaughan Basta M.D on 11/24/2014 at 10:52 AM  Between 7am to 6pm - Pager - [phone]  After 6pm go to [email or web address] - password [email or web address] [facility]  [facility]  Office  [phone]  CC: Primary care physician; No primary care provider on file.

## 2014-11-24 NOTE — Consult Note (Signed)
[facility]  CARDIOLOGY CONSULT NOTE  Patient ID: Hunter Carey MRN: [id number] DOB/AGE: 1935-08-31 79 y.o.  Admit date: 11/23/2014 Referring Physician [facility] Primary Physician   Primary Cardiologist Paraschos Reason for Consultation Chest pain  HPI: Pt is a 79 yo male with history of cad s/p pci in the early 2000's in the lad distribution, with relook cardiac cath in 2014 revealing patent stents.  He has been doing well until Friday of this week when he began feeling intermittent left chest pain and epigastric pain which he states was similar to his angina. He had no signficant changes on his ekg and has ruled out for mi. No further chest pain. Has been compliant with his medications. Pain was non exertional and lasted anywhere from a few minutes to aproximately 1 hour.   ROS Review of Systems - History obtained from chart review and the patient General ROS: positive for  - chest pain Respiratory ROS: no cough, shortness of breath, or wheezing Cardiovascular ROS: positive for - chest pain Gastrointestinal ROS: no abdominal pain, change in bowel habits, or black or bloody stools Musculoskeletal ROS: negative Neurological ROS: no TIA or stroke symptoms   Past Medical History  Diagnosis Date  . Hypertension   . Hyperlipemia   . H/O TIA (transient ischemic attack) and stroke   . COPD (chronic obstructive pulmonary disease)   . CAD (coronary artery disease)   . MI, old 2001    lad stent  . Compression fracture h/o    Family History  Problem Relation Age of Onset  . Hypertension Mother     Social History   Social History  . Marital Status: Married    Spouse Name: N/A  . Number of Children: N/A  . Years of Education: N/A   Occupational History  . Not on file.   Social History Main Topics  . Smoking status: Former Research scientist (life sciences)  . Smokeless tobacco: Never Used  . Alcohol Use: 0.6 oz/week    1 Standard drinks or equivalent per week  .  Drug Use: No  . Sexual Activity: Yes   Other Topics Concern  . Not on file   Social History Narrative  . No narrative on file    Past Surgical History  Procedure Laterality Date  . Coronary angioplasty with stent placement    . Hernia repair    . Vasectomy       Prescriptions prior to admission  Medication Sig Dispense Refill Last Dose  . aspirin EC 81 MG tablet Take 81 mg by mouth daily.   11/23/2014 at Unknown time  . hydrochlorothiazide (HYDRODIURIL) 25 MG tablet Take 12.5 mg by mouth every morning.   11/23/2014 at Unknown time  . magnesium oxide (MAG-OX) 400 (241.3 MG) MG tablet Take 400 mg by mouth daily as needed. For cramps.  3 Past Month at Unknown time  . metoprolol tartrate (LOPRESSOR) 25 MG tablet Take 12.5 mg by mouth 2 (two) times daily.   11/23/2014 at 0530  . nitroGLYCERIN (NITROSTAT) 0.4 MG SL tablet Place 0.4 mg under the tongue every 5 (five) minutes x 3 doses as needed. For chest pain. If no relief call MD or go to emergency room.   prn  . ramipril (ALTACE) 10 MG capsule Take 10 mg by mouth 2 (two) times daily.   11/23/2014 at Unknown time  . simvastatin (ZOCOR) 10 MG tablet Take 10 mg by mouth at bedtime.   11/22/2014 at Unknown time  Physical Exam: Blood pressure 117/61, pulse 50, temperature 97.7 F (36.5 C), temperature source Oral, resp. rate 18, height 5\' 8"  (1.727 m), weight 66.679 kg (147 lb), SpO2 96 %.    General appearance: alert and cooperative Head: Normocephalic, without obvious abnormality, atraumatic Resp: clear to auscultation bilaterally Chest wall: no tenderness Cardio: regular rate and rhythm, S1, S2 normal, no murmur, click, rub or gallop GI: soft, non-tender; bowel sounds normal; no masses,  no organomegaly Extremities: extremities normal, atraumatic, no cyanosis or edema Pulses: 2+ and symmetric Neurologic: Grossly normal Labs:   Lab Results  Component Value Date   WBC 5.6 11/23/2014   HGB 15.8 11/23/2014   HCT 47.2 11/23/2014   MCV  96.1 11/23/2014   PLT 176 11/23/2014    Recent Labs Lab 11/23/14 1042  NA 140  K 3.6  CL 101  CO2 30  BUN 17  CREATININE 0.94  CALCIUM 10.0  GLUCOSE 105*   Lab Results  Component Value Date   TROPONINI <0.03 11/24/2014      Radiology: no airspace disease EKG: nsr with nonspecific st t wave changes.   ASSESSMENT AND PLAN:  79 yo male with history of lad disease s/p pci in early 2000's and had patent stent by relook cath in 2014 admitted with chest pain with both typical and atypical features from cardiac stanpoint. He has ruled out for an mi and is pain free at present. Symptoms due not appear to be due to unstable acs. WOuld discharge on current meds including asa, metoprolol 12.5 mg bid, altace 10 mg daily, zocor 10 mg daily. Follow up with Dr. Saralyn Pilar early next week for consideration for functional tudy.  OK for discharge today.  Signed: Teodoro Spray MD, [facility] 11/24/2014, 11:07 AM

## 2014-11-24 NOTE — Progress Notes (Signed)
Pt in NAD, skin warm and dry, VSS, SR per monitor.  Pt denies any pain or discomfort.  Disharge instructions given to and reviewed with pt, verbalized understanding.  Pt discharged home.

## 2015-03-19 ENCOUNTER — Encounter: Payer: Self-pay | Admitting: *Deleted

## 2015-03-23 NOTE — H&P (Signed)
See scanned H&P

## 2015-03-24 ENCOUNTER — Encounter
Admission: RE | Disposition: A | Discharge status: Home / Self Care | Payer: Self-pay | Source: Ambulatory Visit | Attending: Ophthalmology

## 2015-03-24 ENCOUNTER — Ambulatory Visit: Payer: Medicare Other | Admitting: Certified Registered"

## 2015-03-24 ENCOUNTER — Ambulatory Visit
Admission: RE | Admit: 2015-03-24 | Discharge: 2015-03-24 | Disposition: A | Discharge status: Home / Self Care | Payer: Medicare Other | Source: Ambulatory Visit | Attending: Ophthalmology | Admitting: Ophthalmology

## 2015-03-24 ENCOUNTER — Encounter: Payer: Self-pay | Admitting: *Deleted

## 2015-03-24 DIAGNOSIS — E7800 Pure hypercholesterolemia, unspecified: Secondary | ICD-10-CM | POA: Diagnosis not present

## 2015-03-24 DIAGNOSIS — Z87891 Personal history of nicotine dependence: Secondary | ICD-10-CM | POA: Diagnosis not present

## 2015-03-24 DIAGNOSIS — H269 Unspecified cataract: Secondary | ICD-10-CM | POA: Diagnosis present

## 2015-03-24 DIAGNOSIS — Z9889 Other specified postprocedural states: Secondary | ICD-10-CM | POA: Diagnosis not present

## 2015-03-24 DIAGNOSIS — H2512 Age-related nuclear cataract, left eye: Secondary | ICD-10-CM | POA: Diagnosis not present

## 2015-03-24 DIAGNOSIS — Z7982 Long term (current) use of aspirin: Secondary | ICD-10-CM | POA: Diagnosis not present

## 2015-03-24 DIAGNOSIS — Z79899 Other long term (current) drug therapy: Secondary | ICD-10-CM | POA: Diagnosis not present

## 2015-03-24 DIAGNOSIS — Z955 Presence of coronary angioplasty implant and graft: Secondary | ICD-10-CM | POA: Diagnosis not present

## 2015-03-24 HISTORY — PX: CATARACT EXTRACTION W/PHACO: SHX586

## 2015-03-24 HISTORY — DX: Cardiac murmur, unspecified: R01.1

## 2015-03-24 HISTORY — DX: Edema, unspecified: R60.9

## 2015-03-24 HISTORY — DX: Reserved for inherently not codable concepts without codable children: IMO0001

## 2015-03-24 SURGERY — PHACOEMULSIFICATION, CATARACT, WITH IOL INSERTION
Anesthesia: Monitor Anesthesia Care | Site: Eye | Laterality: Left | Wound class: Clean

## 2015-03-24 MED ORDER — ALFENTANIL 500 MCG/ML IJ INJ
INJECTION | INTRAMUSCULAR | Status: DC | PRN
  Administered 2015-03-24: 500 ug via INTRAVENOUS

## 2015-03-24 MED ORDER — EPINEPHRINE HCL 1 MG/ML IJ SOLN
INTRAMUSCULAR | Status: AC
  Filled 2015-03-24: qty 1

## 2015-03-24 MED ORDER — CARBACHOL 0.01 % IO SOLN
INTRAOCULAR | Status: DC | PRN
  Administered 2015-03-24: .5 mL via INTRAOCULAR

## 2015-03-24 MED ORDER — SODIUM CHLORIDE 0.9 % IV SOLN
INTRAVENOUS | Status: DC
  Administered 2015-03-24: 10:00:00 via INTRAVENOUS

## 2015-03-24 MED ORDER — CEFUROXIME OPHTHALMIC INJECTION 1 MG/0.1 ML
INJECTION | OPHTHALMIC | Status: AC
  Filled 2015-03-24: qty 0.1

## 2015-03-24 MED ORDER — TETRACAINE HCL 0.5 % OP SOLN
OPHTHALMIC | Status: AC
  Filled 2015-03-24: qty 2

## 2015-03-24 MED ORDER — MIDAZOLAM HCL 2 MG/2ML IJ SOLN
INTRAMUSCULAR | Status: DC | PRN
Start: 2015-03-24 — End: 2015-03-24
  Administered 2015-03-24: 1 mg via INTRAVENOUS

## 2015-03-24 MED ORDER — EPINEPHRINE HCL 1 MG/ML IJ SOLN
INTRAOCULAR | Status: DC | PRN
  Administered 2015-03-24: 1 mL via OPHTHALMIC

## 2015-03-24 MED ORDER — MOXIFLOXACIN HCL 0.5 % OP SOLN
OPHTHALMIC | Status: DC | PRN
  Administered 2015-03-24: 1 [drp] via OPHTHALMIC

## 2015-03-24 MED ORDER — CYCLOPENTOLATE HCL 2 % OP SOLN
OPHTHALMIC | Status: AC
  Administered 2015-03-24: 1 [drp] via OPHTHALMIC
  Filled 2015-03-24: qty 2

## 2015-03-24 MED ORDER — CEFUROXIME OPHTHALMIC INJECTION 1 MG/0.1 ML
INJECTION | OPHTHALMIC | Status: DC | PRN
  Administered 2015-03-24: .1 mL via INTRACAMERAL

## 2015-03-24 MED ORDER — LIDOCAINE HCL (PF) 4 % IJ SOLN
INTRAMUSCULAR | Status: AC
  Filled 2015-03-24: qty 5

## 2015-03-24 MED ORDER — LIDOCAINE HCL (PF) 4 % IJ SOLN
INTRAOCULAR | Status: DC | PRN
  Administered 2015-03-24: .5 mL via OPHTHALMIC

## 2015-03-24 MED ORDER — MOXIFLOXACIN HCL 0.5 % OP SOLN
1.0000 [drp] | OPHTHALMIC | Status: AC | PRN
  Administered 2015-03-24 (×3): 1 [drp] via OPHTHALMIC

## 2015-03-24 MED ORDER — LIDOCAINE HCL (PF) 4 % IJ SOLN
INTRAMUSCULAR | Status: DC | PRN
  Administered 2015-03-24: 4 mL via OPHTHALMIC

## 2015-03-24 MED ORDER — GLYCOPYRROLATE 0.2 MG/ML IJ SOLN
INTRAMUSCULAR | Status: DC | PRN
  Administered 2015-03-24: 0.2 mg via INTRAVENOUS

## 2015-03-24 MED ORDER — NA CHONDROIT SULF-NA HYALURON 40-17 MG/ML IO SOLN
INTRAOCULAR | Status: AC
  Filled 2015-03-24: qty 1

## 2015-03-24 MED ORDER — BUPIVACAINE HCL (PF) 0.75 % IJ SOLN
INTRAMUSCULAR | Status: AC
  Filled 2015-03-24: qty 10

## 2015-03-24 MED ORDER — PHENYLEPHRINE HCL 10 % OP SOLN
OPHTHALMIC | Status: AC
  Administered 2015-03-24: 1 [drp] via OPHTHALMIC
  Filled 2015-03-24: qty 5

## 2015-03-24 MED ORDER — HYALURONIDASE HUMAN 150 UNIT/ML IJ SOLN
INTRAMUSCULAR | Status: AC
  Filled 2015-03-24: qty 1

## 2015-03-24 MED ORDER — CYCLOPENTOLATE HCL 2 % OP SOLN
1.0000 [drp] | OPHTHALMIC | Status: AC | PRN
  Administered 2015-03-24 (×4): 1 [drp] via OPHTHALMIC

## 2015-03-24 MED ORDER — PHENYLEPHRINE HCL 10 % OP SOLN
1.0000 [drp] | OPHTHALMIC | Status: AC | PRN
  Administered 2015-03-24 (×4): 1 [drp] via OPHTHALMIC

## 2015-03-24 MED ORDER — NA CHONDROIT SULF-NA HYALURON 40-17 MG/ML IO SOLN
INTRAOCULAR | Status: DC | PRN
  Administered 2015-03-24: 1 mL via INTRAOCULAR

## 2015-03-24 MED ORDER — TETRACAINE HCL 0.5 % OP SOLN
OPHTHALMIC | Status: DC | PRN
  Administered 2015-03-24: 1 [drp] via OPHTHALMIC

## 2015-03-24 MED ORDER — MOXIFLOXACIN HCL 0.5 % OP SOLN
OPHTHALMIC | Status: AC
  Administered 2015-03-24: 1 [drp] via OPHTHALMIC
  Filled 2015-03-24: qty 3

## 2015-03-24 SURGICAL SUPPLY — 29 items

## 2015-03-24 NOTE — Anesthesia Postprocedure Evaluation (Signed)
Anesthesia Post Note  Patient: Hunter Carey  Procedure(s) Performed: Procedure(s) (LRB): CATARACT EXTRACTION PHACO AND INTRAOCULAR LENS PLACEMENT (IOC) (Left)  Patient location during evaluation: Short Stay Anesthesia Type: MAC Level of consciousness: awake and alert Pain management: satisfactory to patient Vital Signs Assessment: post-procedure vital signs reviewed and stable Respiratory status: spontaneous breathing Anesthetic complications: no    Last Vitals:  Filed Vitals:   03/24/15 0855 03/24/15 1048  BP: 130/70 116/68  Pulse: 46 52  Temp: 35.5 C 36.4 C  Resp: 16 16    Last Pain: There were no vitals filed for this visit.               Rolla Plate P

## 2015-03-24 NOTE — Interval H&P Note (Signed)
History and Physical Interval Note:  03/24/2015 9:58 AM  Hunter Carey  has presented today for surgery, with the diagnosis of cataract  The various methods of treatment have been discussed with the patient and family. After consideration of risks, benefits and other options for treatment, the patient has consented to  Procedure(s): CATARACT EXTRACTION PHACO AND INTRAOCULAR LENS PLACEMENT ([place]) (Left) as a surgical intervention .  The patient's history has been reviewed, patient examined, no change in status, stable for surgery.  I have reviewed the patient's chart and labs.  Questions were answered to the patient's satisfaction.     Caitriona Sundquist

## 2015-03-24 NOTE — Transfer of Care (Signed)
Immediate Anesthesia Transfer of Care Note  Patient: Hunter Carey  Procedure(s) Performed: Procedure(s) with comments: CATARACT EXTRACTION PHACO AND INTRAOCULAR LENS PLACEMENT (IOC) (Left) - [place] 01:06 AP% 21.7 CDE 29.12 fluid pack lot # [id number]  Patient Location: Short Stay  Anesthesia Type:MAC  Level of Consciousness: awake and alert   Airway & Oxygen Therapy: Patient Spontanous Breathing  Post-op Assessment: Report given to RN  Post vital signs: Reviewed  Last Vitals:  Filed Vitals:   03/24/15 0855 03/24/15 1048  BP: 130/70 116/68  Pulse: 46 52  Temp: 35.5 C 36.4 C  Resp: 16 16    Complications: No apparent anesthesia complications

## 2015-03-24 NOTE — Op Note (Signed)
Date of Surgery: 03/24/2015 Date of Dictation: 03/24/2015 10:45 AM Pre-operative Diagnosis:  Nuclear Sclerotic Cataract left Eye Post-operative Diagnosis: same Procedure performed: Extra-capsular Cataract Extraction (ECCE) with placement of a posterior chamber intraocular lens (IOL) left Eye IOL:  Implant Name Type Inv. Item Serial No. Manufacturer Lot No. LRB No. Used  LENS IOL ACRYSOF IQ 21.0 - [id number] Intraocular Lens LENS IOL ACRYSOF IQ 21.0 [id number] ALCON   Left 1   Anesthesia: 2% Lidocaine and 4% Marcaine in a 50/50 mixture with 10 unites/ml of Hylenex given as a peribulbar Anesthesiologist: Anesthesiologist: Martha Clan, MD CRNA: Rolla Plate, CRNA Complications: none Estimated Blood Loss: less than 1 ml  Description of procedure:  The patient was given anesthesia and sedation via intravenous access. The patient was then prepped and draped in the usual fashion. A 25-gauge needle was bent for initiating the capsulorhexis. A 5-0 silk suture was placed through the conjunctiva superior and inferiorly to serve as bridle sutures. Hemostasis was obtained at the superior limbus using an eraser cautery. A partial thickness groove was made at the anterior surgical limbus with a 64 Beaver blade and this was dissected anteriorly with an Avaya. The anterior chamber was entered at 10 o'clock with a 1.0 mm paracentesis knife and through the lamellar dissection with a 2.6 mm Alcon keratome. Epi-Shugarcaine 0.5 CC [9 cc BSS Plus (Alcon), 3 cc 4% preservative-free lidocaine (Hospira) and 4 cc 1:1000 preservative-free, bisulfite-free epinephrine] was injected into the anterior chamber via the paracentesis tract. Epi-Shugarcaine 0.5 CC [9 cc BSS Plus (Alcon), 3 cc 4% preservative-free lidocaine (Hospira) and 4 cc 1:1000 preservative-free, bisulfite-free epinephrine] was injected into the anterior chamber via the paracentesis tract. DiscoVisc was injected to replace the aqueous and a  continuous tear curvilinear capsulorhexis was performed using a bent 25-gauge needle.  Balance salt on a syringe was used to perform hydro-dissection and phacoemulsification was carried out using a divide and conquer technique. Procedure(s) with comments: CATARACT EXTRACTION PHACO AND INTRAOCULAR LENS PLACEMENT (IOC) (Left) - [place] 01:06 AP% 21.7 CDE 29.12 fluid pack lot # [id number]. Irrigation/aspiration was used to remove the residual cortex and the capsular bag was inflated with DiscoVisc. The intraocular lens was inserted into the capsular bag using a pre-loaded UltraSert Delivery System. Irrigation/aspiration was used to remove the residual DiscoVisc. The wound was inflated with balanced salt and checked for leaks. None were found. Miostat was injected via the paracentesis track and 0.1 ml of Vigamox containing 1 mg of drug  was injected via the paracentesis track. The wound was checked for leaks again and none were found.   The bridal sutures were removed and two drops of Vigamox were placed on the eye. An eye shield was placed to protect the eye and the patient was discharged to the recovery area in good condition.   Pal Shell MD

## 2015-03-24 NOTE — Anesthesia Preprocedure Evaluation (Signed)
Anesthesia Evaluation  Patient identified by MRN, date of birth, ID band Patient awake    Reviewed: Allergy & Precautions, H&P , NPO status , Patient's Chart, lab work & pertinent test results, reviewed documented beta blocker date and time   History of Anesthesia Complications Negative for: history of anesthetic complications  Airway Mallampati: III  TM Distance: >3 FB Neck ROM: full    Dental no notable dental hx. (+) Caps   Pulmonary shortness of breath and with exertion, neg sleep apnea, COPD, neg recent URI, former smoker,    Pulmonary exam normal breath sounds clear to auscultation       Cardiovascular Exercise Tolerance: Good hypertension, (-) angina+ CAD, + Past MI and + Cardiac Stents (placed in 2001 in the LAD)  (-) CABG Normal cardiovascular exam(-) dysrhythmias + Valvular Problems/Murmurs  Rhythm:regular Rate:Normal     Neuro/Psych neg Seizures TIAnegative psych ROS   GI/Hepatic negative GI ROS, Neg liver ROS,   Endo/Other  negative endocrine ROS  Renal/GU negative Renal ROS  negative genitourinary   Musculoskeletal   Abdominal   Peds  Hematology negative hematology ROS (+)   Anesthesia Other Findings Past Medical History:   Hypertension                                                 Hyperlipemia                                                 H/O TIA (transient ischemic attack) and stroke               COPD (chronic obstructive pulmonary disease) (*              CAD (coronary artery disease)                                Compression fracture                            h/o          Shortness of breath dyspnea                                    Comment:DOE   MI, old                                         2001           Comment:lad stent   Heart murmur                                                 Edema  Comment:VERY MILD OF CALF OCCAS   Reproductive/Obstetrics negative OB ROS                             Anesthesia Physical Anesthesia Plan  ASA: III  Anesthesia Plan: MAC   Post-op Pain Management:    Induction:   Airway Management Planned:   Additional Equipment:   Intra-op Plan:   Post-operative Plan:   Informed Consent: I have reviewed the patients History and Physical, chart, labs and discussed the procedure including the risks, benefits and alternatives for the proposed anesthesia with the patient or authorized representative who has indicated his/her understanding and acceptance.   Dental Advisory Given  Plan Discussed with: Anesthesiologist, CRNA and Surgeon  Anesthesia Plan Comments:         Anesthesia Quick Evaluation

## 2015-03-24 NOTE — Discharge Instructions (Signed)
See hand out.

## 2015-03-25 ENCOUNTER — Encounter: Payer: Self-pay | Admitting: Ophthalmology

## 2015-08-25 ENCOUNTER — Encounter: Payer: Self-pay | Admitting: *Deleted

## 2015-08-25 ENCOUNTER — Ambulatory Visit: Payer: Medicare Other | Admitting: Anesthesiology

## 2015-08-25 ENCOUNTER — Encounter
Admission: RE | Disposition: A | Discharge status: Home / Self Care | Payer: Self-pay | Source: Ambulatory Visit | Attending: Unknown Physician Specialty

## 2015-08-25 ENCOUNTER — Ambulatory Visit
Admission: RE | Admit: 2015-08-25 | Discharge: 2015-08-25 | Disposition: A | Discharge status: Home / Self Care | Payer: Medicare Other | Source: Ambulatory Visit | Attending: Unknown Physician Specialty | Admitting: Unknown Physician Specialty

## 2015-08-25 DIAGNOSIS — I10 Essential (primary) hypertension: Secondary | ICD-10-CM | POA: Diagnosis not present

## 2015-08-25 DIAGNOSIS — Z955 Presence of coronary angioplasty implant and graft: Secondary | ICD-10-CM | POA: Insufficient documentation

## 2015-08-25 DIAGNOSIS — Z79899 Other long term (current) drug therapy: Secondary | ICD-10-CM | POA: Diagnosis not present

## 2015-08-25 DIAGNOSIS — E785 Hyperlipidemia, unspecified: Secondary | ICD-10-CM | POA: Diagnosis not present

## 2015-08-25 DIAGNOSIS — Z87891 Personal history of nicotine dependence: Secondary | ICD-10-CM | POA: Insufficient documentation

## 2015-08-25 DIAGNOSIS — Z8249 Family history of ischemic heart disease and other diseases of the circulatory system: Secondary | ICD-10-CM | POA: Insufficient documentation

## 2015-08-25 DIAGNOSIS — Z1211 Encounter for screening for malignant neoplasm of colon: Secondary | ICD-10-CM | POA: Diagnosis present

## 2015-08-25 DIAGNOSIS — I2510 Atherosclerotic heart disease of native coronary artery without angina pectoris: Secondary | ICD-10-CM | POA: Insufficient documentation

## 2015-08-25 DIAGNOSIS — Z7982 Long term (current) use of aspirin: Secondary | ICD-10-CM | POA: Diagnosis not present

## 2015-08-25 DIAGNOSIS — J449 Chronic obstructive pulmonary disease, unspecified: Secondary | ICD-10-CM | POA: Diagnosis not present

## 2015-08-25 DIAGNOSIS — I252 Old myocardial infarction: Secondary | ICD-10-CM | POA: Diagnosis not present

## 2015-08-25 DIAGNOSIS — D125 Benign neoplasm of sigmoid colon: Secondary | ICD-10-CM | POA: Insufficient documentation

## 2015-08-25 DIAGNOSIS — Z9842 Cataract extraction status, left eye: Secondary | ICD-10-CM | POA: Insufficient documentation

## 2015-08-25 DIAGNOSIS — Z9889 Other specified postprocedural states: Secondary | ICD-10-CM | POA: Insufficient documentation

## 2015-08-25 DIAGNOSIS — D122 Benign neoplasm of ascending colon: Secondary | ICD-10-CM | POA: Insufficient documentation

## 2015-08-25 DIAGNOSIS — K640 First degree hemorrhoids: Secondary | ICD-10-CM | POA: Insufficient documentation

## 2015-08-25 DIAGNOSIS — K5730 Diverticulosis of large intestine without perforation or abscess without bleeding: Secondary | ICD-10-CM | POA: Diagnosis not present

## 2015-08-25 DIAGNOSIS — Z8673 Personal history of transient ischemic attack (TIA), and cerebral infarction without residual deficits: Secondary | ICD-10-CM | POA: Insufficient documentation

## 2015-08-25 DIAGNOSIS — Z9852 Vasectomy status: Secondary | ICD-10-CM | POA: Insufficient documentation

## 2015-08-25 DIAGNOSIS — Z86010 Personal history of colonic polyps: Secondary | ICD-10-CM | POA: Insufficient documentation

## 2015-08-25 DIAGNOSIS — Z800 Family history of malignant neoplasm of digestive organs: Secondary | ICD-10-CM | POA: Insufficient documentation

## 2015-08-25 HISTORY — PX: COLONOSCOPY WITH PROPOFOL: SHX5780

## 2015-08-25 SURGERY — COLONOSCOPY WITH PROPOFOL
Anesthesia: General

## 2015-08-25 MED ORDER — PROPOFOL 10 MG/ML IV BOLUS
INTRAVENOUS | Status: DC | PRN
  Administered 2015-08-25: 60 mg via INTRAVENOUS

## 2015-08-25 MED ORDER — PROPOFOL 500 MG/50ML IV EMUL
INTRAVENOUS | Status: DC | PRN
  Administered 2015-08-25: 150 ug/kg/min via INTRAVENOUS

## 2015-08-25 MED ORDER — SODIUM CHLORIDE 0.9 % IV SOLN: INTRAVENOUS | Status: DC

## 2015-08-25 MED ORDER — SODIUM CHLORIDE 0.9 % IV SOLN
INTRAVENOUS | Status: DC
  Administered 2015-08-25: 09:00:00 via INTRAVENOUS

## 2015-08-25 MED ORDER — LIDOCAINE HCL (CARDIAC) 20 MG/ML IV SOLN
INTRAVENOUS | Status: DC | PRN
  Administered 2015-08-25: 40 mg via INTRAVENOUS

## 2015-08-25 NOTE — Transfer of Care (Signed)
Immediate Anesthesia Transfer of Care Note  Patient: Hunter Carey  Procedure(s) Performed: Procedure(s): COLONOSCOPY WITH PROPOFOL (N/A)  Patient Location: Endoscopy Unit  Anesthesia Type:General  Level of Consciousness: sedated  Airway & Oxygen Therapy: Patient Spontanous Breathing and Patient connected to nasal cannula oxygen  Post-op Assessment: Report given to RN and Post -op Vital signs reviewed and stable  Post vital signs: Reviewed and stable  Last Vitals:  Filed Vitals:   08/25/15 0819  BP: 130/65  Pulse: 48  Temp: 35.6 C  Resp: 18    Last Pain: There were no vitals filed for this visit.       Complications: No apparent anesthesia complications

## 2015-08-25 NOTE — Op Note (Signed)
[facility] Patient Name: Hunter Carey Procedure Date: 08/25/2015 9:39 AM MRN: [id number] Account #: [id number] Date of Birth: 1935-07-08 Admit Type: Outpatient Age: 80 Room: [facility] ENDO ROOM 1 Gender: Male Note Status: Finalized Procedure:            Colonoscopy Indications:          High risk colon cancer surveillance: Personal history                        of colonic polyps Providers:            Manya Silvas, MD Referring MD:         Christena Flake. Raechel Ache, MD (Referring MD) Medicines:            Propofol per Anesthesia Complications:        No immediate complications. Procedure:            Pre-Anesthesia Assessment:                       - After reviewing the risks and benefits, the patient                        was deemed in satisfactory condition to undergo the                        procedure.                       After obtaining informed consent, the colonoscope was                        passed under direct vision. Throughout the procedure,                        the patient's blood pressure, pulse, and oxygen                        saturations were monitored continuously. The                        Colonoscope was introduced through the anus and                        advanced to the the cecum, identified by appendiceal                        orifice and ileocecal valve. The colonoscopy was                        performed without difficulty. The patient tolerated the                        procedure well. The quality of the bowel preparation                        was excellent. Findings:      A diminutive polyp was found in the ascending colon. The polyp was       sessile. The polyp was removed with a cold biopsy forceps. Resection and       retrieval were complete.      A diminutive polyp was  found in the sigmoid colon. The polyp was       sessile. The polyp was removed with a jumbo cold forceps. Resection and       retrieval were  complete.      Many small and large-mouthed diverticula were found in the sigmoid       colon, descending colon and transverse colon.      Internal hemorrhoids were found during endoscopy. The hemorrhoids were       medium-sized and Grade I (internal hemorrhoids that do not prolapse).      The exam was otherwise without abnormality. Impression:           - One diminutive polyp in the ascending colon, removed                        with a cold biopsy forceps. Resected and retrieved.                       - One diminutive polyp in the sigmoid colon, removed                        with a jumbo cold forceps. Resected and retrieved.                       - Diverticulosis in the sigmoid colon, in the                        descending colon and in the transverse colon.                       - Internal hemorrhoids.                       - The examination was otherwise normal. Recommendation:       - Await pathology results. Manya Silvas, MD 08/25/2015 9:58:39 AM This report has been signed electronically. Number of Addenda: 0 Note Initiated On: 08/25/2015 9:39 AM Scope Withdrawal Time: 0 hours 7 minutes 53 seconds  Total Procedure Duration: 0 hours 11 minutes 25 seconds       [facility]

## 2015-08-25 NOTE — Anesthesia Preprocedure Evaluation (Signed)
Anesthesia Evaluation  Patient identified by MRN, date of birth, ID band Patient awake    Reviewed: Allergy & Precautions, H&P , NPO status , Patient's Chart, lab work & pertinent test results, reviewed documented beta blocker date and time   History of Anesthesia Complications Negative for: history of anesthetic complications  Airway Mallampati: II  TM Distance: >3 FB Neck ROM: full    Dental no notable dental hx. (+) Teeth Intact   Pulmonary shortness of breath and with exertion, COPD, neg recent URI, former smoker,    Pulmonary exam normal breath sounds clear to auscultation       Cardiovascular Exercise Tolerance: Good hypertension, (-) angina+ CAD, + Past MI and + Cardiac Stents  (-) CABG Normal cardiovascular exam(-) dysrhythmias + Valvular Problems/Murmurs MR  Rhythm:regular Rate:Normal     Neuro/Psych neg Seizures TIAnegative psych ROS   GI/Hepatic negative GI ROS, Neg liver ROS,   Endo/Other  negative endocrine ROS  Renal/GU negative Renal ROS  negative genitourinary   Musculoskeletal   Abdominal   Peds  Hematology negative hematology ROS (+)   Anesthesia Other Findings Past Medical History:   Hypertension                                                 Hyperlipemia                                                 H/O TIA (transient ischemic attack) and stroke               COPD (chronic obstructive pulmonary disease) (*              CAD (coronary artery disease)                                Compression fracture                            h/o          Shortness of breath dyspnea                                    Comment:DOE   MI, old                                         2001           Comment:lad stent   Heart murmur                                                 Edema  Comment:VERY MILD OF CALF OCCAS   Reproductive/Obstetrics negative OB  ROS                             Anesthesia Physical Anesthesia Plan  ASA: III  Anesthesia Plan: General   Post-op Pain Management:    Induction:   Airway Management Planned:   Additional Equipment:   Intra-op Plan:   Post-operative Plan:   Informed Consent: I have reviewed the patients History and Physical, chart, labs and discussed the procedure including the risks, benefits and alternatives for the proposed anesthesia with the patient or authorized representative who has indicated his/her understanding and acceptance.   Dental Advisory Given  Plan Discussed with: Anesthesiologist, CRNA and Surgeon  Anesthesia Plan Comments:         Anesthesia Quick Evaluation

## 2015-08-25 NOTE — H&P (Signed)
Primary Care Physician:  Ezequiel Kayser, MD Primary Gastroenterologist:  Dr. Vira Agar  Pre-Procedure History & Physical: HPI:  Hunter Carey is a 80 y.o. male is here for an colonoscopy.   Past Medical History  Diagnosis Date  . Hypertension   . Hyperlipemia   . H/O TIA (transient ischemic attack) and stroke   . COPD (chronic obstructive pulmonary disease) ([place])   . CAD (coronary artery disease)   . Compression fracture h/o  . Shortness of breath dyspnea     DOE  . MI, old 2001    lad stent  . Heart murmur   . Edema     VERY MILD OF CALF OCCAS    Past Surgical History  Procedure Laterality Date  . Coronary angioplasty with stent placement    . Hernia repair    . Vasectomy    . Cataract extraction w/phaco Left 03/24/2015    Procedure: CATARACT EXTRACTION PHACO AND INTRAOCULAR LENS PLACEMENT (IOC);  Surgeon: Estill Cotta, MD;  Location: [facility] ORS;  Service: Ophthalmology;  Laterality: Left;  [place] 01:06 AP% 21.7 CDE 29.12 fluid pack lot # [id number]    Prior to Admission medications   Medication Sig Start Date End Date Taking? Authorizing Provider  aspirin EC 81 MG tablet Take 81 mg by mouth daily.   Yes Historical Provider, MD  hydrochlorothiazide (HYDRODIURIL) 25 MG tablet Take 12.5 mg by mouth every morning. 11/22/14  Yes Historical Provider, MD  isosorbide mononitrate (IMDUR) 30 MG 24 hr tablet Take 30 mg by mouth daily.   Yes Historical Provider, MD  metoprolol tartrate (LOPRESSOR) 25 MG tablet Take 12.5 mg by mouth 2 (two) times daily. 11/22/14  Yes Historical Provider, MD  ramipril (ALTACE) 10 MG capsule Take 10 mg by mouth 2 (two) times daily. 11/22/14  Yes Historical Provider, MD  simvastatin (ZOCOR) 10 MG tablet Take 10 mg by mouth at bedtime. 11/22/14  Yes Historical Provider, MD  magnesium oxide (MAG-OX) 400 (241.3 MG) MG tablet Take 400 mg by mouth daily as needed. Reported on 08/25/2015 08/21/14   Historical Provider, MD  nitroGLYCERIN (NITROSTAT) 0.4 MG SL tablet Place  0.4 mg under the tongue every 5 (five) minutes x 3 doses as needed. For chest pain. If no relief call MD or go to emergency room.    Historical Provider, MD    Allergies as of 08/08/2015  . (No Known Allergies)    Family History  Problem Relation Age of Onset  . Hypertension Mother     Social History   Social History  . Marital Status: Married    Spouse Name: N/A  . Number of Children: N/A  . Years of Education: N/A   Occupational History  . Not on file.   Social History Main Topics  . Smoking status: Former Research scientist (life sciences)  . Smokeless tobacco: Never Used  . Alcohol Use: 0.6 oz/week    1 Standard drinks or equivalent per week  . Drug Use: No  . Sexual Activity: Yes   Other Topics Concern  . Not on file   Social History Narrative    Review of Systems: See HPI, otherwise negative ROS  Physical Exam: BP 130/65 mmHg  Pulse 48  Temp(Src) 96 F (35.6 C) (Tympanic)  Resp 18  Ht 5\' 8"  (1.727 m)  Wt 65.772 kg (145 lb)  BMI 22.05 kg/m2  SpO2 100% General:   Alert,  pleasant and cooperative in NAD Head:  Normocephalic and atraumatic. Neck:  Supple; no masses or thyromegaly. Lungs:  Clear throughout to auscultation.    Heart:  Regular rate and rhythm. Abdomen:  Soft, nontender and nondistended. Normal bowel sounds, without guarding, and without rebound.   Neurologic:  Alert and  oriented x4;  grossly normal neurologically.  Impression/Plan: Hunter Carey is here for an colonoscopy to be performed for [facility] colon [place] in brother  Risks, benefits, limitations, and alternatives regarding  colonoscopy have been reviewed with the patient.  Questions have been answered.  All parties agreeable.   Gaylyn Cheers, MD  08/25/2015, 9:29 AM

## 2015-08-25 NOTE — Anesthesia Postprocedure Evaluation (Signed)
Anesthesia Post Note  Patient: Hunter Carey  Procedure(s) Performed: Procedure(s) (LRB): COLONOSCOPY WITH PROPOFOL (N/A)  Patient location during evaluation: Endoscopy Anesthesia Type: General Level of consciousness: awake and alert Pain management: pain level controlled Vital Signs Assessment: post-procedure vital signs reviewed and stable Respiratory status: spontaneous breathing, nonlabored ventilation, respiratory function stable and patient connected to nasal cannula oxygen Cardiovascular status: blood pressure returned to baseline and stable Postop Assessment: no signs of nausea or vomiting Anesthetic complications: no    Last Vitals:  Filed Vitals:   08/25/15 1019 08/25/15 1029  BP: 116/64 141/68  Pulse: 39 49  Temp:    Resp: 15 20    Last Pain: There were no vitals filed for this visit.               Martha Clan

## 2015-08-26 LAB — SURGICAL PATHOLOGY

## 2015-08-27 ENCOUNTER — Encounter: Payer: Self-pay | Admitting: Unknown Physician Specialty

## 2016-02-23 ENCOUNTER — Emergency Department
Admission: EM | Admit: 2016-02-23 | Discharge: 2016-02-23 | Disposition: A | Discharge status: Home / Self Care | Payer: Medicare Other | Attending: Emergency Medicine | Admitting: Emergency Medicine

## 2016-02-23 ENCOUNTER — Emergency Department: Payer: Medicare Other

## 2016-02-23 ENCOUNTER — Encounter: Payer: Self-pay | Admitting: Emergency Medicine

## 2016-02-23 DIAGNOSIS — Z955 Presence of coronary angioplasty implant and graft: Secondary | ICD-10-CM | POA: Diagnosis not present

## 2016-02-23 DIAGNOSIS — Z7982 Long term (current) use of aspirin: Secondary | ICD-10-CM | POA: Diagnosis not present

## 2016-02-23 DIAGNOSIS — J449 Chronic obstructive pulmonary disease, unspecified: Secondary | ICD-10-CM | POA: Diagnosis not present

## 2016-02-23 DIAGNOSIS — I10 Essential (primary) hypertension: Secondary | ICD-10-CM | POA: Insufficient documentation

## 2016-02-23 DIAGNOSIS — I2510 Atherosclerotic heart disease of native coronary artery without angina pectoris: Secondary | ICD-10-CM | POA: Diagnosis not present

## 2016-02-23 DIAGNOSIS — Z87891 Personal history of nicotine dependence: Secondary | ICD-10-CM | POA: Diagnosis not present

## 2016-02-23 DIAGNOSIS — K2970 Gastritis, unspecified, without bleeding: Secondary | ICD-10-CM | POA: Insufficient documentation

## 2016-02-23 DIAGNOSIS — R1013 Epigastric pain: Secondary | ICD-10-CM

## 2016-02-23 DIAGNOSIS — Z79899 Other long term (current) drug therapy: Secondary | ICD-10-CM | POA: Insufficient documentation

## 2016-02-23 LAB — URINALYSIS, COMPLETE (UACMP) WITH MICROSCOPIC
BACTERIA UA: NONE SEEN
Bilirubin Urine: NEGATIVE
Glucose, UA: NEGATIVE mg/dL
Ketones, ur: NEGATIVE mg/dL
Leukocytes, UA: NEGATIVE
Nitrite: NEGATIVE
PROTEIN: NEGATIVE mg/dL
SQUAMOUS EPITHELIAL / LPF: NONE SEEN
Specific Gravity, Urine: 1.024 (ref 1.005–1.030)
WBC UA: NONE SEEN WBC/hpf (ref 0–5)
pH: 5 (ref 5.0–8.0)

## 2016-02-23 LAB — COMPREHENSIVE METABOLIC PANEL
ALBUMIN: 3.9 g/dL (ref 3.5–5.0)
ALK PHOS: 50 U/L (ref 38–126)
ALT: 25 U/L (ref 17–63)
AST: 32 U/L (ref 15–41)
Anion gap: 5 (ref 5–15)
BUN: 22 mg/dL — AB (ref 6–20)
CALCIUM: 9.6 mg/dL (ref 8.9–10.3)
CO2: 30 mmol/L (ref 22–32)
CREATININE: 0.87 mg/dL (ref 0.61–1.24)
Chloride: 103 mmol/L (ref 101–111)
GFR calc non Af Amer: >60 mL/min (ref >60)
GLUCOSE: 96 mg/dL (ref 65–99)
Potassium: 4.7 mmol/L (ref 3.5–5.1)
SODIUM: 138 mmol/L (ref 135–145)
Total Bilirubin: 0.7 mg/dL (ref 0.3–1.2)
Total Protein: 6.9 g/dL (ref 6.5–8.1)

## 2016-02-23 LAB — TROPONIN I: Troponin I: <0.03 ng/mL (ref <0.03)

## 2016-02-23 LAB — LIPASE, BLOOD: Lipase: 42 U/L (ref 11–51)

## 2016-02-23 MED ORDER — FAMOTIDINE 20 MG PO TABS: 20.0000 mg | ORAL_TABLET | Freq: Two times a day (BID) | ORAL | 0 refills | Status: DC

## 2016-02-23 MED ORDER — ASPIRIN 81 MG PO CHEW
324.0000 mg | CHEWABLE_TABLET | Freq: Once | ORAL | Status: AC
Start: 2016-02-23 — End: 2016-02-23
  Administered 2016-02-23: 324 mg via ORAL
  Filled 2016-02-23: qty 4

## 2016-02-23 MED ORDER — IOPAMIDOL (ISOVUE-300) INJECTION 61%
30.0000 mL | Freq: Once | INTRAVENOUS | Status: AC | PRN
  Administered 2016-02-23: 30 mL via ORAL

## 2016-02-23 MED ORDER — FAMOTIDINE 20 MG PO TABS
40.0000 mg | ORAL_TABLET | Freq: Once | ORAL | Status: AC
  Administered 2016-02-23: 40 mg via ORAL
  Filled 2016-02-23: qty 2

## 2016-02-23 MED ORDER — ALUM & MAG HYDROXIDE-SIMETH 200-200-20 MG/5ML PO SUSP
30.0000 mL | Freq: Once | ORAL | Status: AC
  Administered 2016-02-23: 30 mL via ORAL
  Filled 2016-02-23: qty 30

## 2016-02-23 MED ORDER — SUCRALFATE 1 G PO TABS: 1.0000 g | ORAL_TABLET | Freq: Four times a day (QID) | ORAL | 1 refills | Status: DC

## 2016-02-23 MED ORDER — IOPAMIDOL (ISOVUE-300) INJECTION 61%
100.0000 mL | Freq: Once | INTRAVENOUS | Status: AC | PRN
  Administered 2016-02-23: 100 mL via INTRAVENOUS

## 2016-02-23 NOTE — ED Triage Notes (Signed)
Started with left sided chest pain with nausea. Happened again this AM and took NTG at home. "felt like heart was beating really hard". Pain improved with NTG. Hx MI

## 2016-02-23 NOTE — ED Notes (Signed)
Patient made aware of need of urine sample. States he is unable to void at this time. 

## 2016-02-23 NOTE — ED Provider Notes (Signed)
[facility] Emergency Department Provider Note  ____________________________________________  Time seen: Approximately 1:09 PM  I have reviewed the triage vital signs and the nursing notes.   HISTORY  Chief Complaint Chest Pain    HPI Hunter Carey is a 80 y.o. male 's of chest pain associated with nausea. When asked where the pain as he actually indicates his epigastrium. He's had this ongoing for the last day or 2, so she was some palpitation sensation. Nausea but no vomiting. No diarrhea. Eating and drinking normally. Not exertional nor pleuritic. No shortness of breath dizziness radiation or diaphoresis.     Past Medical History:  Diagnosis Date  . CAD (coronary artery disease)   . Compression fracture h/o  . COPD (chronic obstructive pulmonary disease) ([place])   . Edema    VERY MILD OF CALF OCCAS  . H/O TIA (transient ischemic attack) and stroke   . Heart murmur   . Hyperlipemia   . Hypertension   . MI, old 2001   lad stent  . Shortness of breath dyspnea    DOE     Patient Active Problem List   Diagnosis Date Noted  . Chest pain 11/23/2014     Past Surgical History:  Procedure Laterality Date  . CATARACT EXTRACTION W/PHACO Left 03/24/2015   Procedure: CATARACT EXTRACTION PHACO AND INTRAOCULAR LENS PLACEMENT (IOC);  Surgeon: Estill Cotta, MD;  Location: [facility] ORS;  Service: Ophthalmology;  Laterality: Left;  [place] 01:06 AP% 21.7 CDE 29.12 fluid pack lot # [id number]  . COLONOSCOPY WITH PROPOFOL N/A 08/25/2015   Procedure: COLONOSCOPY WITH PROPOFOL;  Surgeon: Manya Silvas, MD;  Location: [facility] ENDOSCOPY;  Service: Endoscopy;  Laterality: N/A;  . CORONARY ANGIOPLASTY WITH STENT PLACEMENT    . HERNIA REPAIR    . VASECTOMY       Prior to Admission medications   Medication Sig Start Date End Date Taking? Authorizing Provider  aspirin EC 81 MG tablet Take 81 mg by mouth daily.   Yes Historical Provider, MD  hydrochlorothiazide  (HYDRODIURIL) 25 MG tablet Take 12.5 mg by mouth every morning. 11/22/14  Yes Historical Provider, MD  isosorbide mononitrate (IMDUR) 30 MG 24 hr tablet Take 30 mg by mouth daily.   Yes Historical Provider, MD  metoprolol tartrate (LOPRESSOR) 25 MG tablet Take 12.5 mg by mouth 2 (two) times daily. 11/22/14  Yes Historical Provider, MD  nitroGLYCERIN (NITROSTAT) 0.4 MG SL tablet Place 0.4 mg under the tongue every 5 (five) minutes x 3 doses as needed for chest pain.    Yes Historical Provider, MD  ramipril (ALTACE) 10 MG capsule Take 10 mg by mouth 2 (two) times daily. 11/22/14  Yes Historical Provider, MD  simvastatin (ZOCOR) 10 MG tablet Take 10 mg by mouth at bedtime. 11/22/14  Yes Historical Provider, MD  famotidine (PEPCID) 20 MG tablet Take 1 tablet (20 mg total) by mouth 2 (two) times daily. 02/23/16   Carrie Mew, MD  sucralfate (CARAFATE) 1 g tablet Take 1 tablet (1 g total) by mouth 4 (four) times daily. 02/23/16   Carrie Mew, MD     Allergies Patient has no known allergies.   Family History  Problem Relation Age of Onset  . Hypertension Mother     Social History Social History  Substance Use Topics  . Smoking status: Former Research scientist (life sciences)  . Smokeless tobacco: Never Used  . Alcohol use 0.6 oz/week    1 Standard drinks or equivalent per week    Review of  Systems  Constitutional:   No fever or chills.  ENT:   No sore throat. No rhinorrhea. Cardiovascular:   No chest pain. Respiratory:   No dyspnea or cough. Gastrointestinal:   Positive epigastric pain without vomiting and diarrhea.  Genitourinary:   Negative for dysuria or difficulty urinating. Musculoskeletal:   Negative for focal pain or swelling Neurological:   Negative for headaches 10-point ROS otherwise negative.  ____________________________________________   PHYSICAL EXAM:  VITAL SIGNS: ED Triage Vitals  Enc Vitals Group     BP 02/23/16 0902 120/78     Pulse Rate 02/23/16 0902 (!) 57     Resp 02/23/16  0902 17     Temp 02/23/16 0902 97.9 F (36.6 C)     Temp Source 02/23/16 0902 Oral     SpO2 02/23/16 0902 98 %     Weight 02/23/16 0903 145 lb (65.8 kg)     Height 02/23/16 0903 5\' 8"  (1.727 m)     Head Circumference --      Peak Flow --      Pain Score 02/23/16 1100 0     Pain Loc --      Pain Edu? --      Excl. in [place]? --     Vital signs reviewed, nursing assessments reviewed.   Constitutional:   Alert and oriented. Well appearing and in no distress. Eyes:   No scleral icterus. No conjunctival pallor. PERRL. EOMI.  No nystagmus. ENT   Head:   Normocephalic and atraumatic.   Nose:   No congestion/rhinnorhea. No septal hematoma   Mouth/Throat:   MMM, no pharyngeal erythema. No peritonsillar mass.    Neck:   No stridor. No SubQ emphysema. No meningismus. Hematological/Lymphatic/Immunilogical:   No cervical lymphadenopathy. Cardiovascular:   RRR. Symmetric bilateral radial and DP pulses.  No murmurs.  Respiratory:   Normal respiratory effort without tachypnea nor retractions. Breath sounds are clear and equal bilaterally. No wheezes/rales/rhonchi. Chest wall nontender Gastrointestinal:   Soft with epigastric tenderness. No pulsatile mass. No bruit.. Non distended. There is no CVA tenderness.  No rebound, rigidity, or guarding. Genitourinary:   deferred Musculoskeletal:   Nontender with normal range of motion in all extremities. No joint effusions.  No lower extremity tenderness.  No edema. Neurologic:   Normal speech and language.  CN 2-10 normal. Motor grossly intact. No gross focal neurologic deficits are appreciated.  Skin:    Skin is warm, dry and intact. No rash noted.  No petechiae, purpura, or bullae.  ____________________________________________    LABS (pertinent positives/negatives) (all labs ordered are listed, but only abnormal results are displayed) Labs Reviewed  COMPREHENSIVE METABOLIC PANEL - Abnormal; Notable for the following:       Result Value    BUN 22 (*)    All other components within normal limits  URINALYSIS, COMPLETE (UACMP) WITH MICROSCOPIC - Abnormal; Notable for the following:    Color, Urine YELLOW (*)    APPearance CLEAR (*)    Hgb urine dipstick SMALL (*)    All other components within normal limits  TROPONIN I  LIPASE, BLOOD   ____________________________________________   EKG  Interpreted by me Sinus bradycardia rate of 49, normal axis and intervals. Normal QRS ST segments and T waves.  ____________________________________________    RADIOLOGY  Chest x-ray unremarkable CT abdomen and pelvis unremarkable  ____________________________________________   PROCEDURES Procedures  ____________________________________________   INITIAL IMPRESSION / ASSESSMENT AND PLAN / ED COURSE  Pertinent labs & imaging results that were  available during my care of the patient were reviewed by me and considered in my medical decision making (see chart for details).  Patient well appearing no acute distress, complains of nausea with epigastric pain. Given antiacids while pursuing a workup. Labs unremarkable, chest x-ray and CT scan unremarkable.Considering the patient's symptoms, medical history, and physical examination today, I have low suspicion for cholecystitis or biliary pathology, pancreatitis, perforation or bowel obstruction, hernia, intra-abdominal abscess, AAA or dissection, volvulus or intussusception, mesenteric ischemia, or appendicitis.  No evidence of PE dissection or ACS. Feeling better after antiacids. We'll discharge home to follow up closely with primary care. Symptomatically management at this point.     Clinical Course    ____________________________________________   FINAL CLINICAL IMPRESSION(S) / ED DIAGNOSES  Final diagnoses:  Epigastric pain  Gastritis without bleeding, unspecified chronicity, unspecified gastritis type      New Prescriptions   FAMOTIDINE (PEPCID) 20 MG TABLET     Take 1 tablet (20 mg total) by mouth 2 (two) times daily.   SUCRALFATE (CARAFATE) 1 G TABLET    Take 1 tablet (1 g total) by mouth 4 (four) times daily.     Portions of this note were generated with dragon dictation software. Dictation errors may occur despite best attempts at proofreading.    Carrie Mew, MD 02/23/16 [phone]

## 2016-04-12 ENCOUNTER — Ambulatory Visit
Admission: EM | Admit: 2016-04-12 | Discharge: 2016-04-12 | Disposition: A | Discharge status: Home / Self Care | Payer: Medicare Other | Attending: Family Medicine | Admitting: Family Medicine

## 2016-04-12 DIAGNOSIS — J019 Acute sinusitis, unspecified: Secondary | ICD-10-CM | POA: Diagnosis not present

## 2016-04-12 MED ORDER — AMOXICILLIN-POT CLAVULANATE 875-125 MG PO TABS: 1.0000 | ORAL_TABLET | Freq: Two times a day (BID) | ORAL | 0 refills | Status: DC

## 2016-04-12 MED ORDER — BENZONATATE 200 MG PO CAPS: 200.0000 mg | ORAL_CAPSULE | Freq: Three times a day (TID) | ORAL | 0 refills | Status: DC | PRN

## 2016-04-12 MED ORDER — FEXOFENADINE-PSEUDOEPHED ER 60-120 MG PO TB12: 1.0000 | ORAL_TABLET | Freq: Two times a day (BID) | ORAL | 0 refills | Status: DC

## 2016-04-12 NOTE — ED Triage Notes (Signed)
Patient states that he has sinus pain and pressure with coughing. Patient states that symptoms started on Saturday. Patient reports that he has tried taking coricidin without relief.

## 2016-04-12 NOTE — ED Provider Notes (Signed)
[facility]    CSN: [id number] Arrival date & time: 04/12/16  0920     History   Chief Complaint Chief Complaint  Patient presents with  . Facial Pain    HPI Hunter Carey is a 81 y.o. male.   Patient is here for left facial pain started on Saturday reports pain in the left side of his face left nostril was running he has rhinorrhea. He denies the right side he is also coughing and the cough is nonproductive most the time. As stated this started Saturday and has continued to do today. Allergic to any medications. He stopped smoking about 1314 years ago had a significant cardiovascular event about the same time as well and required to have stents put in. He does have a history hypertension and hyperlipidemia he also has has COPD and coronary artery disease compression fractures well. No pertinent family medical history relevant to today's visit   The history is provided by the patient. No language interpreter was used.  Cough  Cough characteristics:  Non-productive Severity:  Moderate Onset quality:  Sudden Timing:  Constant Context: upper respiratory infection   Context: not smoke exposure   Associated symptoms: rhinorrhea and sinus congestion   Associated symptoms: no chest pain, no diaphoresis, no ear fullness, no ear pain, no fever, no shortness of breath and no sore throat   Rhinorrhea:    Quality:  Clear   Severity:  Moderate   Timing:  Constant   Progression:  Worsening   Past Medical History:  Diagnosis Date  . CAD (coronary artery disease)   . Compression fracture h/o  . COPD (chronic obstructive pulmonary disease) ([place])   . Edema    VERY MILD OF CALF OCCAS  . H/O TIA (transient ischemic attack) and stroke   . Heart murmur   . Hyperlipemia   . Hypertension   . MI, old 2001   lad stent  . Shortness of breath dyspnea    DOE    Patient Active Problem List   Diagnosis Date Noted  . Chest pain 11/23/2014    Past Surgical History:    Procedure Laterality Date  . CATARACT EXTRACTION W/PHACO Left 03/24/2015   Procedure: CATARACT EXTRACTION PHACO AND INTRAOCULAR LENS PLACEMENT (IOC);  Surgeon: Estill Cotta, MD;  Location: [facility] ORS;  Service: Ophthalmology;  Laterality: Left;  [place] 01:06 AP% 21.7 CDE 29.12 fluid pack lot # [id number]  . COLONOSCOPY WITH PROPOFOL N/A 08/25/2015   Procedure: COLONOSCOPY WITH PROPOFOL;  Surgeon: Manya Silvas, MD;  Location: [facility] ENDOSCOPY;  Service: Endoscopy;  Laterality: N/A;  . CORONARY ANGIOPLASTY WITH STENT PLACEMENT    . HERNIA REPAIR    . VASECTOMY         Home Medications    Prior to Admission medications   Medication Sig Start Date End Date Taking? Authorizing Provider  aspirin EC 81 MG tablet Take 81 mg by mouth daily.   Yes Historical Provider, MD  hydrochlorothiazide (HYDRODIURIL) 25 MG tablet Take 12.5 mg by mouth every morning. 11/22/14  Yes Historical Provider, MD  isosorbide mononitrate (IMDUR) 30 MG 24 hr tablet Take 30 mg by mouth daily.   Yes Historical Provider, MD  metoprolol tartrate (LOPRESSOR) 25 MG tablet Take 12.5 mg by mouth 2 (two) times daily. 11/22/14  Yes Historical Provider, MD  nitroGLYCERIN (NITROSTAT) 0.4 MG SL tablet Place 0.4 mg under the tongue every 5 (five) minutes x 3 doses as needed for chest pain.    Yes Historical Provider,  MD  ramipril (ALTACE) 10 MG capsule Take 10 mg by mouth 2 (two) times daily. 11/22/14  Yes Historical Provider, MD  ranitidine (ZANTAC) 150 MG capsule Take 150 mg by mouth 2 (two) times daily.   Yes Historical Provider, MD  simvastatin (ZOCOR) 10 MG tablet Take 10 mg by mouth at bedtime. 11/22/14  Yes Historical Provider, MD  amoxicillin-clavulanate (AUGMENTIN) 875-125 MG tablet Take 1 tablet by mouth 2 (two) times daily. 04/12/16   Frederich Cha, MD  benzonatate (TESSALON) 200 MG capsule Take 1 capsule (200 mg total) by mouth 3 (three) times daily as needed. 04/12/16   Frederich Cha, MD  famotidine (PEPCID) 20 MG tablet Take 1 tablet  (20 mg total) by mouth 2 (two) times daily. 02/23/16   Carrie Mew, MD  fexofenadine-pseudoephedrine (ALLEGRA-D) 60-120 MG 12 hr tablet Take 1 tablet by mouth every 12 (twelve) hours. 04/12/16   Frederich Cha, MD  sucralfate (CARAFATE) 1 g tablet Take 1 tablet (1 g total) by mouth 4 (four) times daily. 02/23/16   Carrie Mew, MD    Family History Family History  Problem Relation Age of Onset  . Hypertension Mother     Social History Social History  Substance Use Topics  . Smoking status: Former Research scientist (life sciences)  . Smokeless tobacco: Never Used  . Alcohol use 0.6 oz/week    1 Standard drinks or equivalent per week     Allergies   Patient has no known allergies.   Review of Systems Review of Systems  Constitutional: Negative for diaphoresis and fever.  HENT: Positive for facial swelling, postnasal drip and rhinorrhea. Negative for ear pain and sore throat.   Respiratory: Positive for cough. Negative for shortness of breath.   Cardiovascular: Negative for chest pain.  Musculoskeletal: Negative for arthralgias and back pain.  All other systems reviewed and are negative.    Physical Exam Triage Vital Signs ED Triage Vitals  Enc Vitals Group     BP 04/12/16 0947 (!) 120/55     Pulse Rate 04/12/16 0947 (!) 59     Resp 04/12/16 0947 17     Temp 04/12/16 0947 97.9 F (36.6 C)     Temp Source 04/12/16 0947 Oral     SpO2 04/12/16 0947 95 %     Weight 04/12/16 0945 145 lb (65.8 kg)     Height 04/12/16 0945 5\' 8"  (1.727 m)     Head Circumference --      Peak Flow --      Pain Score 04/12/16 0947 4     Pain Loc --      Pain Edu? --      Excl. in [place]? --    No data found.   Updated Vital Signs BP (!) 120/55 (BP Location: Left Arm)   Pulse (!) 59   Temp 97.9 F (36.6 C) (Oral)   Resp 17   Ht 5\' 8"  (1.727 m)   Wt 145 lb (65.8 kg)   SpO2 95%   BMI 22.05 kg/m   Visual Acuity Right Eye Distance:   Left Eye Distance:   Bilateral Distance:    Right Eye Near:   Left  Eye Near:    Bilateral Near:     Physical Exam  Constitutional: He is oriented to person, place, and time. He appears well-developed and well-nourished.  HENT:  Head: Normocephalic and atraumatic.  Right Ear: Hearing and external ear normal.  Left Ear: Hearing and external ear normal.  Nose: Rhinorrhea present. Right sinus exhibits  no maxillary sinus tenderness and no frontal sinus tenderness. Left sinus exhibits no maxillary sinus tenderness and no frontal sinus tenderness.    He reports pain and pressure behind the left nostril and the left nostril area. He states she's having clear rhinorrhea at this time.  Eyes: Pupils are equal, round, and reactive to light.  Neck: Normal range of motion. Neck supple.  Cardiovascular: Normal rate.   Pulmonary/Chest: Effort normal and breath sounds normal. No stridor.  Actively coughing  Musculoskeletal: Normal range of motion. He exhibits no edema.  Lymphadenopathy:    He has cervical adenopathy.  Neurological: He is alert and oriented to person, place, and time.  Skin: Skin is warm.  Psychiatric: He has a normal mood and affect.  Vitals reviewed.    UC Treatments / Results  Labs (all labs ordered are listed, but only abnormal results are displayed) Labs Reviewed - No data to display  EKG  EKG Interpretation None       Radiology No results found.  Procedures Procedures (including critical care time)  Medications Ordered in UC Medications - No data to display   Initial Impression / Assessment and Plan / UC Course  I have reviewed the triage vital signs and the nursing notes.  Pertinent labs & imaging results that were available during my care of the patient were reviewed by me and considered in my medical decision making (see chart for details).    Due to patient's age and comorbidity we'll place him in place Augmentin 875 one tablet twice a day Allegra-D and Tessalon Perles for cough. Follow-up PCP if not better. 1-2  weeks   Final Clinical Impressions(s) / UC Diagnoses   Final diagnoses:  Acute non-recurrent sinusitis, unspecified location    New Prescriptions New Prescriptions   AMOXICILLIN-CLAVULANATE (AUGMENTIN) 875-125 MG TABLET    Take 1 tablet by mouth 2 (two) times daily.   BENZONATATE (TESSALON) 200 MG CAPSULE    Take 1 capsule (200 mg total) by mouth 3 (three) times daily as needed.   FEXOFENADINE-PSEUDOEPHEDRINE (ALLEGRA-D) 60-120 MG 12 HR TABLET    Take 1 tablet by mouth every 12 (twelve) hours.     Frederich Cha, MD 04/12/16 1041

## 2017-08-09 ENCOUNTER — Emergency Department
Admission: EM | Admit: 2017-08-09 | Discharge: 2017-08-09 | Disposition: A | Discharge status: Home / Self Care | Payer: Medicare Other | Attending: Emergency Medicine | Admitting: Emergency Medicine

## 2017-08-09 ENCOUNTER — Emergency Department: Payer: Medicare Other

## 2017-08-09 ENCOUNTER — Other Ambulatory Visit: Payer: Self-pay

## 2017-08-09 ENCOUNTER — Encounter: Payer: Self-pay | Admitting: Emergency Medicine

## 2017-08-09 DIAGNOSIS — Z8679 Personal history of other diseases of the circulatory system: Secondary | ICD-10-CM | POA: Insufficient documentation

## 2017-08-09 DIAGNOSIS — I10 Essential (primary) hypertension: Secondary | ICD-10-CM | POA: Diagnosis not present

## 2017-08-09 DIAGNOSIS — I2510 Atherosclerotic heart disease of native coronary artery without angina pectoris: Secondary | ICD-10-CM | POA: Insufficient documentation

## 2017-08-09 DIAGNOSIS — J449 Chronic obstructive pulmonary disease, unspecified: Secondary | ICD-10-CM | POA: Diagnosis not present

## 2017-08-09 DIAGNOSIS — R110 Nausea: Secondary | ICD-10-CM | POA: Insufficient documentation

## 2017-08-09 DIAGNOSIS — Z7982 Long term (current) use of aspirin: Secondary | ICD-10-CM | POA: Insufficient documentation

## 2017-08-09 DIAGNOSIS — Z87891 Personal history of nicotine dependence: Secondary | ICD-10-CM | POA: Diagnosis not present

## 2017-08-09 DIAGNOSIS — R0602 Shortness of breath: Secondary | ICD-10-CM | POA: Diagnosis not present

## 2017-08-09 DIAGNOSIS — Z79899 Other long term (current) drug therapy: Secondary | ICD-10-CM | POA: Diagnosis not present

## 2017-08-09 DIAGNOSIS — I252 Old myocardial infarction: Secondary | ICD-10-CM | POA: Diagnosis not present

## 2017-08-09 DIAGNOSIS — R079 Chest pain, unspecified: Secondary | ICD-10-CM | POA: Diagnosis present

## 2017-08-09 LAB — CBC
HEMATOCRIT: 42.8 % (ref 40.0–52.0)
HEMOGLOBIN: 14.5 g/dL (ref 13.0–18.0)
MCH: 32.4 pg (ref 26.0–34.0)
MCHC: 33.8 g/dL (ref 32.0–36.0)
MCV: 95.9 fL (ref 80.0–100.0)
Platelets: 222 10*3/uL (ref 150–440)
RBC: 4.46 MIL/uL (ref 4.40–5.90)
RDW: 13.3 % (ref 11.5–14.5)
WBC: 4.7 10*3/uL (ref 3.8–10.6)

## 2017-08-09 LAB — BASIC METABOLIC PANEL
ANION GAP: 7 (ref 5–15)
BUN: 22 mg/dL — AB (ref 6–20)
CO2: 30 mmol/L (ref 22–32)
Calcium: 9.5 mg/dL (ref 8.9–10.3)
Chloride: 101 mmol/L (ref 101–111)
Creatinine, Ser: 0.9 mg/dL (ref 0.61–1.24)
GFR calc Af Amer: >60 mL/min (ref >60)
GFR calc non Af Amer: >60 mL/min (ref >60)
GLUCOSE: 175 mg/dL — AB (ref 65–99)
POTASSIUM: 3.9 mmol/L (ref 3.5–5.1)
Sodium: 138 mmol/L (ref 135–145)

## 2017-08-09 LAB — TROPONIN I: Troponin I: <0.03 ng/mL (ref <0.03)

## 2017-08-09 MED ORDER — IPRATROPIUM-ALBUTEROL 0.5-2.5 (3) MG/3ML IN SOLN
3.0000 mL | Freq: Once | RESPIRATORY_TRACT | Status: AC
  Administered 2017-08-09: 3 mL via RESPIRATORY_TRACT
  Filled 2017-08-09: qty 3

## 2017-08-09 NOTE — ED Triage Notes (Signed)
Patient ambulatory to triage with steady gait, without difficulty or distress noted; pt reports since 4am feeling SHOB and "uncomfortable" in upper chest relieved some by NTG; st hx MI

## 2017-08-09 NOTE — ED Notes (Signed)
Patient to Room 14, Cassie RN answering phone for [employer] is aware of placement in room.

## 2017-08-09 NOTE — Discharge Instructions (Signed)
Follow up with your doctors for continued monitoring of your symptoms. Your blood tests today did not reveal any acute concerns.

## 2017-08-09 NOTE — ED Provider Notes (Addendum)
 -----------------------------------------   9:52 AM on 08/09/2017 -----------------------------------------  Assumed care of patient from Dr. Dahlia Client awaiting second troponin. That result is negative. Patient has no recurrence or worsening of symptoms, unremarkable vital signs, sitting upright, calm and comfortable and asymptomatic, suitable for discharge home. Follow up with primary care and cardiology.   Carrie Mew, MD 08/09/17 5834    Carrie Mew, MD 08/09/17 1005

## 2017-08-09 NOTE — ED Notes (Signed)
1023 Note entered in error.

## 2017-08-09 NOTE — ED Provider Notes (Signed)
[facility] Emergency Department Provider Note   ____________________________________________   First MD Initiated Contact with Patient 08/09/17 [phone]     (approximate)  I have reviewed the triage vital signs and the nursing notes.   HISTORY  Chief Complaint Chest Pain    HPI Hunter Carey is a 82 y.o. male who comes into the hospital today not feeling well this morning.  The patient states that he felt short of breath and had some tightness in his upper chest.  The patient states that he has had heart attacks in the past so he was concerned.  He took a nitroglycerin and he reports that the symptoms eased off.  The patient states that he still sometimes feels tightness if he takes a deep breath but it is mainly gone.  The patient denies any sweats, vomiting, dizziness or lightheadedness.  The patient denies radiation of his pain.  He has had some nausea.  He is here today for evaluation.   Past Medical History:  Diagnosis Date  . CAD (coronary artery disease)   . Compression fracture h/o  . COPD (chronic obstructive pulmonary disease) ([place])   . Edema    VERY MILD OF CALF OCCAS  . H/O TIA (transient ischemic attack) and stroke   . Heart murmur   . Hyperlipemia   . Hypertension   . MI, old 2001   lad stent  . Shortness of breath dyspnea    DOE    Patient Active Problem List   Diagnosis Date Noted  . Chest pain 11/23/2014    Past Surgical History:  Procedure Laterality Date  . CATARACT EXTRACTION W/PHACO Left 03/24/2015   Procedure: CATARACT EXTRACTION PHACO AND INTRAOCULAR LENS PLACEMENT (IOC);  Surgeon: Estill Cotta, MD;  Location: [facility] ORS;  Service: Ophthalmology;  Laterality: Left;  [place] 01:06 AP% 21.7 CDE 29.12 fluid pack lot # [id number]  . COLONOSCOPY WITH PROPOFOL N/A 08/25/2015   Procedure: COLONOSCOPY WITH PROPOFOL;  Surgeon: Manya Silvas, MD;  Location: [facility] ENDOSCOPY;  Service: Endoscopy;  Laterality: N/A;  . CORONARY  ANGIOPLASTY WITH STENT PLACEMENT    . HERNIA REPAIR    . VASECTOMY      Prior to Admission medications   Medication Sig Start Date End Date Taking? Authorizing Provider  amoxicillin-clavulanate (AUGMENTIN) 875-125 MG tablet Take 1 tablet by mouth 2 (two) times daily. 04/12/16   Frederich Cha, MD  aspirin EC 81 MG tablet Take 81 mg by mouth daily.    [provider]  benzonatate (TESSALON) 200 MG capsule Take 1 capsule (200 mg total) by mouth 3 (three) times daily as needed. 04/12/16   Frederich Cha, MD  famotidine (PEPCID) 20 MG tablet Take 1 tablet (20 mg total) by mouth 2 (two) times daily. 02/23/16   Carrie Mew, MD  fexofenadine-pseudoephedrine (ALLEGRA-D) 60-120 MG 12 hr tablet Take 1 tablet by mouth every 12 (twelve) hours. 04/12/16   Frederich Cha, MD  hydrochlorothiazide (HYDRODIURIL) 25 MG tablet Take 12.5 mg by mouth every morning. 11/22/14   [provider]  isosorbide mononitrate (IMDUR) 30 MG 24 hr tablet Take 30 mg by mouth daily.    [provider]  metoprolol tartrate (LOPRESSOR) 25 MG tablet Take 12.5 mg by mouth 2 (two) times daily. 11/22/14   [provider]  nitroGLYCERIN (NITROSTAT) 0.4 MG SL tablet Place 0.4 mg under the tongue every 5 (five) minutes x 3 doses as needed for chest pain.     [provider]  ramipril (  ALTACE) 10 MG capsule Take 10 mg by mouth 2 (two) times daily. 11/22/14   [provider]  ranitidine (ZANTAC) 150 MG capsule Take 150 mg by mouth 2 (two) times daily.    [provider]  simvastatin (ZOCOR) 10 MG tablet Take 10 mg by mouth at bedtime. 11/22/14   [provider]  sucralfate (CARAFATE) 1 g tablet Take 1 tablet (1 g total) by mouth 4 (four) times daily. 02/23/16   Carrie Mew, MD    Allergies Patient has no known allergies.  Family History  Problem Relation Age of Onset  . Hypertension Mother     Social History Social History   Tobacco Use  . Smoking status: Former  Research scientist (life sciences)  . Smokeless tobacco: Never Used  Substance Use Topics  . Alcohol use: Yes    Alcohol/week: 0.6 oz    Types: 1 Standard drinks or equivalent per week  . Drug use: No    Review of Systems  Constitutional: No fever/chills Eyes: No visual changes. ENT: No sore throat. Cardiovascular:  chest pain. Respiratory: Denies shortness of breath. Gastrointestinal:  Nausea with No abdominal pain.  no vomiting.  No diarrhea.  No constipation. Genitourinary: Negative for dysuria. Musculoskeletal: Negative for back pain. Skin: Negative for rash. Neurological: Negative for headaches   ____________________________________________   PHYSICAL EXAM:  VITAL SIGNS: ED Triage Vitals  Enc Vitals Group     BP 08/09/17 0503 131/62     Pulse Rate 08/09/17 0503 (!) 55     Resp 08/09/17 0503 16     Temp 08/09/17 0503 97.8 F (36.6 C)     Temp Source 08/09/17 0503 Oral     SpO2 08/09/17 0503 97 %     Weight 08/09/17 0454 135 lb (61.2 kg)     Height 08/09/17 0454 5\' 8"  (1.727 m)     Head Circumference --      Peak Flow --      Pain Score 08/09/17 0453 2     Pain Loc --      Pain Edu? --      Excl. in [place]? --     Constitutional: Alert and oriented. Well appearing and in mild distress. Eyes: Conjunctivae are normal. PERRL. EOMI. Head: Atraumatic. Nose: No congestion/rhinnorhea. Mouth/Throat: Mucous membranes are moist.  Oropharynx non-erythematous. Cardiovascular: Normal rate, regular rhythm. Grossly normal heart sounds.  Good peripheral circulation. Respiratory: Normal respiratory effort.  No retractions. Lungs CTAB. Gastrointestinal: Soft and nontender. No distention. Positive bowel sounds Musculoskeletal: No lower extremity tenderness nor edema.   Neurologic:  Normal speech and language.  Skin:  Skin is warm, dry and intact.  Psychiatric: Mood and affect are normal.   ____________________________________________   LABS (all labs ordered are listed, but only abnormal results are  displayed)  Labs Reviewed  BASIC METABOLIC PANEL - Abnormal; Notable for the following components:      Result Value   Glucose, Bld 175 (*)    BUN 22 (*)    All other components within normal limits  CBC  TROPONIN I  TROPONIN I   ____________________________________________  EKG  ED ECG REPORT I, Loney Hering, the attending physician, personally viewed and interpreted this ECG.   Date: 08/09/2017  EKG Time: 459  Rate: 57  Rhythm: sinus bradycardia  Axis: normal  Intervals:none  ST&T Change: none  ____________________________________________  RADIOLOGY  ED MD interpretation:  CXR: COPD without acute airspace disease  Official radiology report(s): Dg Chest 2 View  Result Date:  08/09/2017 CLINICAL DATA:  Chest pain EXAM: CHEST - 2 VIEW COMPARISON:  02/23/2016 FINDINGS: The lungs are hyperinflated with diffuse interstitial prominence. No focal airspace consolidation or pulmonary edema. No pleural effusion or pneumothorax. Normal cardiomediastinal contours. IMPRESSION: COPD without acute airspace disease. Electronically Signed   By: Ulyses Jarred M.D.   On: 08/09/2017 05:21    ____________________________________________   PROCEDURES  Procedure(s) performed: None  Procedures  Critical Care performed: No  ____________________________________________   INITIAL IMPRESSION / ASSESSMENT AND PLAN / ED COURSE  As part of my medical decision making, I reviewed the following data within the electronic MEDICAL RECORD NUMBER Notes from prior ED visits and [employer] Controlled Substance Database   This is a 82 year old male who comes into the hospital today with some chest pain.  The patient states it woke him up out of his sleep.  He did not have any radiation nausea vomiting but he did have some sweats.  The patient's symptoms did improve with nitroglycerin.  My differential diagnosis includes acute coronary syndrome, pneumonia, musculoskeletal pain, reflux.  I did check  some blood work on the patient to include a CBC, BMP, troponin and a chest x-ray.  The patient's blood work is unremarkable and his chest x-ray is negative.  It appears that the patient does have some hyperinflation though.  I will give him a DuoNeb treatment and we will repeat his troponin after 3 hours.   The patient's initial blood work is unremarkable.  He will have a repeat of his troponin.  The patient's care was signed out to Dr. Joni Fears who will reassess the patient and determine his disposition.     ____________________________________________   FINAL CLINICAL IMPRESSION(S) / ED DIAGNOSES  Final diagnoses:  Chest pain, unspecified type     ED Discharge Orders    None       Note:  This document was prepared using Dragon voice recognition software and may include unintentional dictation errors.    Loney Hering, MD 08/09/17 [phone]

## 2018-02-13 ENCOUNTER — Other Ambulatory Visit: Payer: Self-pay

## 2018-02-17 NOTE — Discharge Instructions (Signed)

## 2018-02-21 ENCOUNTER — Ambulatory Visit: Payer: Medicare Other | Admitting: Anesthesiology

## 2018-02-21 ENCOUNTER — Ambulatory Visit
Admission: RE | Admit: 2018-02-21 | Discharge: 2018-02-21 | Disposition: A | Discharge status: Home / Self Care | Payer: Medicare Other | Attending: Ophthalmology | Admitting: Ophthalmology

## 2018-02-21 ENCOUNTER — Encounter
Admission: RE | Disposition: A | Discharge status: Home / Self Care | Payer: Self-pay | Source: Home / Self Care | Attending: Ophthalmology

## 2018-02-21 DIAGNOSIS — H2511 Age-related nuclear cataract, right eye: Secondary | ICD-10-CM | POA: Insufficient documentation

## 2018-02-21 DIAGNOSIS — I10 Essential (primary) hypertension: Secondary | ICD-10-CM | POA: Insufficient documentation

## 2018-02-21 DIAGNOSIS — I252 Old myocardial infarction: Secondary | ICD-10-CM | POA: Insufficient documentation

## 2018-02-21 DIAGNOSIS — Z79899 Other long term (current) drug therapy: Secondary | ICD-10-CM | POA: Diagnosis not present

## 2018-02-21 DIAGNOSIS — Z7982 Long term (current) use of aspirin: Secondary | ICD-10-CM | POA: Diagnosis not present

## 2018-02-21 DIAGNOSIS — E7800 Pure hypercholesterolemia, unspecified: Secondary | ICD-10-CM | POA: Diagnosis not present

## 2018-02-21 DIAGNOSIS — Z955 Presence of coronary angioplasty implant and graft: Secondary | ICD-10-CM | POA: Insufficient documentation

## 2018-02-21 DIAGNOSIS — Z87891 Personal history of nicotine dependence: Secondary | ICD-10-CM | POA: Diagnosis not present

## 2018-02-21 DIAGNOSIS — J449 Chronic obstructive pulmonary disease, unspecified: Secondary | ICD-10-CM | POA: Diagnosis not present

## 2018-02-21 DIAGNOSIS — I2510 Atherosclerotic heart disease of native coronary artery without angina pectoris: Secondary | ICD-10-CM | POA: Insufficient documentation

## 2018-02-21 HISTORY — PX: CATARACT EXTRACTION W/PHACO: SHX586

## 2018-02-21 SURGERY — PHACOEMULSIFICATION, CATARACT, WITH IOL INSERTION
Anesthesia: Monitor Anesthesia Care | Site: Eye | Laterality: Right

## 2018-02-21 MED ORDER — SODIUM HYALURONATE 23 MG/ML IO SOLN
INTRAOCULAR | Status: DC | PRN
  Administered 2018-02-21: 0.6 mL via INTRAOCULAR

## 2018-02-21 MED ORDER — LACTATED RINGERS IV SOLN: INTRAVENOUS | Status: DC

## 2018-02-21 MED ORDER — MOXIFLOXACIN HCL 0.5 % OP SOLN
OPHTHALMIC | Status: DC | PRN
  Administered 2018-02-21: 0.2 mL via OPHTHALMIC

## 2018-02-21 MED ORDER — TETRACAINE HCL 0.5 % OP SOLN
1.0000 [drp] | OPHTHALMIC | Status: DC | PRN
  Administered 2018-02-21 (×2): 1 [drp] via OPHTHALMIC

## 2018-02-21 MED ORDER — LIDOCAINE HCL (PF) 2 % IJ SOLN
INTRAOCULAR | Status: DC | PRN
  Administered 2018-02-21: 1 mL via INTRAOCULAR

## 2018-02-21 MED ORDER — ARMC OPHTHALMIC DILATING DROPS
1.0000 "application " | OPHTHALMIC | Status: DC | PRN
  Administered 2018-02-21 (×3): 1 via OPHTHALMIC

## 2018-02-21 MED ORDER — FENTANYL CITRATE (PF) 100 MCG/2ML IJ SOLN
INTRAMUSCULAR | Status: DC | PRN
  Administered 2018-02-21: 25 ug via INTRAVENOUS

## 2018-02-21 MED ORDER — ACETAMINOPHEN 160 MG/5ML PO SOLN: 325.0000 mg | Freq: Once | ORAL | Status: DC

## 2018-02-21 MED ORDER — EPINEPHRINE PF 1 MG/ML IJ SOLN
INTRAOCULAR | Status: DC | PRN
  Administered 2018-02-21: 95 mL via OPHTHALMIC

## 2018-02-21 MED ORDER — SODIUM HYALURONATE 10 MG/ML IO SOLN
INTRAOCULAR | Status: DC | PRN
  Administered 2018-02-21: 0.55 mL via INTRAOCULAR

## 2018-02-21 MED ORDER — MIDAZOLAM HCL 2 MG/2ML IJ SOLN
INTRAMUSCULAR | Status: DC | PRN
  Administered 2018-02-21: 1 mg via INTRAVENOUS

## 2018-02-21 MED ORDER — ACETAMINOPHEN 325 MG PO TABS: 325.0000 mg | ORAL_TABLET | Freq: Once | ORAL | Status: DC

## 2018-02-21 SURGICAL SUPPLY — 19 items
CANNULA ANT/CHMB 27G (MISCELLANEOUS) ×2 IMPLANT
CANNULA ANT/CHMB 27GA (MISCELLANEOUS) ×6 IMPLANT
DISSECTOR HYDRO NUCLEUS 50X22 (MISCELLANEOUS) ×3 IMPLANT
GLOVE SURG LX 7.5 STRW (GLOVE) ×2
GLOVE SURG LX STRL 7.5 STRW (GLOVE) ×1 IMPLANT
GLOVE SURG SYN 8.5  E (GLOVE) ×2
GLOVE SURG SYN 8.5 E (GLOVE) ×1 IMPLANT
GLOVE SURG SYN 8.5 PF PI (GLOVE) ×1 IMPLANT
GOWN STRL REUS W/ TWL LRG LVL3 (GOWN DISPOSABLE) ×2 IMPLANT
GOWN STRL REUS W/TWL LRG LVL3 (GOWN DISPOSABLE) ×4
LENS IOL ACRYSOF IQ 20.0 (Intraocular Lens) ×2 IMPLANT
MARKER SKIN DUAL TIP RULER LAB (MISCELLANEOUS) ×3 IMPLANT
PACK DR. KING ARMS (PACKS) ×3 IMPLANT
PACK EYE AFTER SURG (MISCELLANEOUS) ×3 IMPLANT
PACK OPTHALMIC (MISCELLANEOUS) ×3 IMPLANT
SYR 3ML LL SCALE MARK (SYRINGE) ×3 IMPLANT
SYR TB 1ML LUER SLIP (SYRINGE) ×3 IMPLANT
WATER STERILE IRR 500ML POUR (IV SOLUTION) ×3 IMPLANT
WIPE NON LINTING 3.25X3.25 (MISCELLANEOUS) ×3 IMPLANT

## 2018-02-21 NOTE — Anesthesia Postprocedure Evaluation (Signed)
Anesthesia Post Note  Patient: Hunter Carey  Procedure(s) Performed: CATARACT EXTRACTION PHACO AND INTRAOCULAR LENS PLACEMENT ([place]) RIGHT (Right Eye)  Patient location during evaluation: PACU Anesthesia Type: MAC Level of consciousness: awake and alert and oriented Pain management: satisfactory to patient Vital Signs Assessment: post-procedure vital signs reviewed and stable Respiratory status: spontaneous breathing, nonlabored ventilation and respiratory function stable Cardiovascular status: blood pressure returned to baseline and stable Postop Assessment: Adequate PO intake and No signs of nausea or vomiting Anesthetic complications: no    Raliegh Ip

## 2018-02-21 NOTE — Op Note (Signed)
OPERATIVE NOTE  Hunter Carey [id number] 02/21/2018   PREOPERATIVE DIAGNOSIS:  Nuclear sclerotic cataract right eye.  H25.11   POSTOPERATIVE DIAGNOSIS:    Nuclear sclerotic cataract right eye.     PROCEDURE:  Phacoemusification with posterior chamber intraocular lens placement of the right eye   LENS:   Implant Name Type Inv. Item Serial No. Manufacturer Lot No. LRB No. Used  LENS IOL ACRYSOF IQ 20.0 - [id number] Intraocular Lens LENS IOL ACRYSOF IQ 20.0 [id number] ALCON  Right 1       [id number] +20.0   ULTRASOUND TIME: 1 minutes 01 seconds.  CDE 6.20   SURGEON:  Benay Pillow, MD, MPH  ANESTHESIOLOGIST: Anesthesiologist: Ronelle Nigh, MD CRNA: Cameron Ali, CRNA   ANESTHESIA:  Topical with tetracaine drops augmented with 1% preservative-free intracameral lidocaine.  ESTIMATED BLOOD LOSS: less than 1 mL.   COMPLICATIONS:  None.   DESCRIPTION OF PROCEDURE:  The patient was identified in the holding room and transported to the operating room and placed in the supine position under the operating microscope.  The right eye was identified as the operative eye and it was prepped and draped in the usual sterile ophthalmic fashion.   A 1.0 millimeter clear-corneal paracentesis was made at the 10:30 position. 0.5 ml of preservative-free 1% lidocaine with epinephrine was injected into the anterior chamber.  The anterior chamber was filled with Healon 5 viscoelastic.  A 2.4 millimeter keratome was used to make a near-clear corneal incision at the 8:00 position.  A curvilinear capsulorrhexis was made with a cystotome and capsulorrhexis forceps.  Balanced salt solution was used to hydrodissect and hydrodelineate the nucleus.   Phacoemulsification was then used in stop and chop fashion to remove the lens nucleus and epinucleus.  The remaining cortex was then removed using the irrigation and aspiration handpiece. Healon was then placed into the capsular bag to distend it for lens placement.   A lens was then injected into the capsular bag.  The remaining viscoelastic was aspirated.   Wounds were hydrated with balanced salt solution.  The anterior chamber was inflated to a physiologic pressure with balanced salt solution.   Intracameral vigamox 0.1 mL undiluted was injected into the eye and a drop placed onto the ocular surface.  No wound leaks were noted.  The patient was taken to the recovery room in stable condition without complications of anesthesia or surgery  Benay Pillow 02/21/2018, 9:09 AM

## 2018-02-21 NOTE — Anesthesia Preprocedure Evaluation (Signed)
Anesthesia Evaluation  Patient identified by MRN, date of birth, ID band Patient awake    Reviewed: Allergy & Precautions, H&P , NPO status , Patient's Chart, lab work & pertinent test results  Airway Mallampati: II  TM Distance: >3 FB Neck ROM: full    Dental no notable dental hx.    Pulmonary shortness of breath, COPD, former smoker,    Pulmonary exam normal breath sounds clear to auscultation       Cardiovascular hypertension, + CAD and + Past MI  Normal cardiovascular exam Rhythm:regular Rate:Normal     Neuro/Psych    GI/Hepatic   Endo/Other    Renal/GU      Musculoskeletal   Abdominal   Peds  Hematology   Anesthesia Other Findings   Reproductive/Obstetrics                             Anesthesia Physical Anesthesia Plan  ASA: III  Anesthesia Plan: MAC   Post-op Pain Management:    Induction:   PONV Risk Score and Plan: 1 and Midazolam and Treatment may vary due to age or medical condition  Airway Management Planned:   Additional Equipment:   Intra-op Plan:   Post-operative Plan:   Informed Consent: I have reviewed the patients History and Physical, chart, labs and discussed the procedure including the risks, benefits and alternatives for the proposed anesthesia with the patient or authorized representative who has indicated his/her understanding and acceptance.     Plan Discussed with: CRNA  Anesthesia Plan Comments:         Anesthesia Quick Evaluation

## 2018-02-21 NOTE — Anesthesia Procedure Notes (Signed)
Procedure Name: MAC Date/Time: 02/21/2018 8:45 AM Performed by: Cameron Ali, CRNA Pre-anesthesia Checklist: Patient identified, Emergency Drugs available, Suction available, Timeout performed and Patient being monitored Patient Re-evaluated:Patient Re-evaluated prior to induction Oxygen Delivery Method: Nasal cannula Placement Confirmation: positive ETCO2

## 2018-02-21 NOTE — Transfer of Care (Signed)
Immediate Anesthesia Transfer of Care Note  Patient: Hunter Carey  Procedure(s) Performed: CATARACT EXTRACTION PHACO AND INTRAOCULAR LENS PLACEMENT (IOC) RIGHT (Right Eye)  Patient Location: PACU  Anesthesia Type: MAC  Level of Consciousness: awake, alert  and patient cooperative  Airway and Oxygen Therapy: Patient Spontanous Breathing and Patient connected to supplemental oxygen  Post-op Assessment: Post-op Vital signs reviewed, Patient's Cardiovascular Status Stable, Respiratory Function Stable, Patent Airway and No signs of Nausea or vomiting  Post-op Vital Signs: Reviewed and stable  Complications: No apparent anesthesia complications

## 2018-02-21 NOTE — H&P (Signed)

## 2018-02-22 ENCOUNTER — Encounter: Payer: Self-pay | Admitting: Ophthalmology

## 2018-08-11 ENCOUNTER — Other Ambulatory Visit
Admission: RE | Admit: 2018-08-11 | Discharge: 2018-08-11 | Disposition: A | Discharge status: Home / Self Care | Payer: Medicare Other | Source: Ambulatory Visit | Attending: Cardiology | Admitting: Cardiology

## 2018-08-11 ENCOUNTER — Other Ambulatory Visit: Payer: Self-pay

## 2018-08-11 DIAGNOSIS — R9430 Abnormal result of cardiovascular function study, unspecified: Secondary | ICD-10-CM | POA: Diagnosis present

## 2018-08-11 DIAGNOSIS — Z955 Presence of coronary angioplasty implant and graft: Secondary | ICD-10-CM | POA: Diagnosis not present

## 2018-08-11 DIAGNOSIS — Z87891 Personal history of nicotine dependence: Secondary | ICD-10-CM | POA: Diagnosis not present

## 2018-08-11 DIAGNOSIS — J439 Emphysema, unspecified: Secondary | ICD-10-CM | POA: Diagnosis not present

## 2018-08-11 DIAGNOSIS — R0602 Shortness of breath: Secondary | ICD-10-CM | POA: Diagnosis not present

## 2018-08-11 DIAGNOSIS — Z9852 Vasectomy status: Secondary | ICD-10-CM | POA: Diagnosis not present

## 2018-08-11 DIAGNOSIS — Z8249 Family history of ischemic heart disease and other diseases of the circulatory system: Secondary | ICD-10-CM | POA: Diagnosis not present

## 2018-08-11 DIAGNOSIS — Z7982 Long term (current) use of aspirin: Secondary | ICD-10-CM | POA: Diagnosis not present

## 2018-08-11 DIAGNOSIS — I700 Atherosclerosis of aorta: Secondary | ICD-10-CM | POA: Diagnosis not present

## 2018-08-11 DIAGNOSIS — Z823 Family history of stroke: Secondary | ICD-10-CM | POA: Diagnosis not present

## 2018-08-11 DIAGNOSIS — I10 Essential (primary) hypertension: Secondary | ICD-10-CM | POA: Diagnosis not present

## 2018-08-11 DIAGNOSIS — Z8673 Personal history of transient ischemic attack (TIA), and cerebral infarction without residual deficits: Secondary | ICD-10-CM | POA: Diagnosis not present

## 2018-08-11 DIAGNOSIS — I2510 Atherosclerotic heart disease of native coronary artery without angina pectoris: Secondary | ICD-10-CM | POA: Diagnosis not present

## 2018-08-11 DIAGNOSIS — I252 Old myocardial infarction: Secondary | ICD-10-CM | POA: Diagnosis not present

## 2018-08-11 DIAGNOSIS — E782 Mixed hyperlipidemia: Secondary | ICD-10-CM | POA: Diagnosis not present

## 2018-08-11 DIAGNOSIS — Z1159 Encounter for screening for other viral diseases: Secondary | ICD-10-CM | POA: Diagnosis not present

## 2018-08-11 DIAGNOSIS — Z79899 Other long term (current) drug therapy: Secondary | ICD-10-CM | POA: Diagnosis not present

## 2018-08-12 LAB — NOVEL CORONAVIRUS, NAA (HOSP ORDER, SEND-OUT TO REF LAB; TAT 18-24 HRS): SARS-CoV-2, NAA: NOT DETECTED

## 2018-08-14 MED ORDER — ASPIRIN 81 MG PO CHEW: 81.0000 mg | CHEWABLE_TABLET | ORAL | Status: DC

## 2018-08-14 MED ORDER — SODIUM CHLORIDE 0.9% FLUSH: 3.0000 mL | Freq: Two times a day (BID) | INTRAVENOUS | Status: DC

## 2018-08-14 MED ORDER — SODIUM CHLORIDE 0.9 % WEIGHT BASED INFUSION: 1.0000 mL/kg/h | INTRAVENOUS | Status: DC

## 2018-08-14 MED ORDER — SODIUM CHLORIDE 0.9 % IV SOLN: 250.0000 mL | INTRAVENOUS | Status: DC | PRN

## 2018-08-14 MED ORDER — SODIUM CHLORIDE 0.9 % WEIGHT BASED INFUSION: 3.0000 mL/kg/h | INTRAVENOUS | Status: DC

## 2018-08-14 MED ORDER — SODIUM CHLORIDE 0.9% FLUSH: 3.0000 mL | INTRAVENOUS | Status: DC | PRN

## 2018-08-15 ENCOUNTER — Encounter
Admission: RE | Disposition: A | Discharge status: Home / Self Care | Payer: Self-pay | Source: Home / Self Care | Attending: Cardiology

## 2018-08-15 ENCOUNTER — Ambulatory Visit
Admission: RE | Admit: 2018-08-15 | Discharge: 2018-08-15 | Disposition: A | Discharge status: Home / Self Care | Payer: Medicare Other | Attending: Cardiology | Admitting: Cardiology

## 2018-08-15 ENCOUNTER — Other Ambulatory Visit: Payer: Self-pay

## 2018-08-15 ENCOUNTER — Encounter: Payer: Self-pay | Admitting: *Deleted

## 2018-08-15 DIAGNOSIS — I2510 Atherosclerotic heart disease of native coronary artery without angina pectoris: Secondary | ICD-10-CM | POA: Insufficient documentation

## 2018-08-15 DIAGNOSIS — R0602 Shortness of breath: Secondary | ICD-10-CM | POA: Insufficient documentation

## 2018-08-15 DIAGNOSIS — E782 Mixed hyperlipidemia: Secondary | ICD-10-CM | POA: Insufficient documentation

## 2018-08-15 DIAGNOSIS — Z823 Family history of stroke: Secondary | ICD-10-CM | POA: Insufficient documentation

## 2018-08-15 DIAGNOSIS — Z7982 Long term (current) use of aspirin: Secondary | ICD-10-CM | POA: Insufficient documentation

## 2018-08-15 DIAGNOSIS — Z9852 Vasectomy status: Secondary | ICD-10-CM | POA: Insufficient documentation

## 2018-08-15 DIAGNOSIS — R9430 Abnormal result of cardiovascular function study, unspecified: Secondary | ICD-10-CM | POA: Insufficient documentation

## 2018-08-15 DIAGNOSIS — I252 Old myocardial infarction: Secondary | ICD-10-CM | POA: Insufficient documentation

## 2018-08-15 DIAGNOSIS — Z955 Presence of coronary angioplasty implant and graft: Secondary | ICD-10-CM | POA: Insufficient documentation

## 2018-08-15 DIAGNOSIS — Z79899 Other long term (current) drug therapy: Secondary | ICD-10-CM | POA: Insufficient documentation

## 2018-08-15 DIAGNOSIS — I10 Essential (primary) hypertension: Secondary | ICD-10-CM | POA: Insufficient documentation

## 2018-08-15 DIAGNOSIS — I700 Atherosclerosis of aorta: Secondary | ICD-10-CM | POA: Insufficient documentation

## 2018-08-15 DIAGNOSIS — J439 Emphysema, unspecified: Secondary | ICD-10-CM | POA: Insufficient documentation

## 2018-08-15 DIAGNOSIS — Z1159 Encounter for screening for other viral diseases: Secondary | ICD-10-CM | POA: Insufficient documentation

## 2018-08-15 DIAGNOSIS — Z87891 Personal history of nicotine dependence: Secondary | ICD-10-CM | POA: Insufficient documentation

## 2018-08-15 DIAGNOSIS — Z8249 Family history of ischemic heart disease and other diseases of the circulatory system: Secondary | ICD-10-CM | POA: Insufficient documentation

## 2018-08-15 DIAGNOSIS — Z8673 Personal history of transient ischemic attack (TIA), and cerebral infarction without residual deficits: Secondary | ICD-10-CM | POA: Insufficient documentation

## 2018-08-15 HISTORY — PX: LEFT HEART CATH AND CORONARY ANGIOGRAPHY: CATH118249

## 2018-08-15 SURGERY — LEFT HEART CATH AND CORONARY ANGIOGRAPHY
Anesthesia: Moderate Sedation | Laterality: Left

## 2018-08-15 MED ORDER — IOHEXOL 300 MG/ML  SOLN
INTRAMUSCULAR | Status: DC | PRN
  Administered 2018-08-15: 120 mL via INTRA_ARTERIAL

## 2018-08-15 MED ORDER — VERAPAMIL HCL 2.5 MG/ML IV SOLN
INTRAVENOUS | Status: AC
  Filled 2018-08-15: qty 2

## 2018-08-15 MED ORDER — ASPIRIN 81 MG PO CHEW: 81.0000 mg | CHEWABLE_TABLET | ORAL | Status: DC

## 2018-08-15 MED ORDER — LABETALOL HCL 5 MG/ML IV SOLN: 10.0000 mg | INTRAVENOUS | Status: DC | PRN

## 2018-08-15 MED ORDER — VERAPAMIL HCL 2.5 MG/ML IV SOLN
INTRAVENOUS | Status: DC | PRN
  Administered 2018-08-15: 2.5 mg via INTRA_ARTERIAL

## 2018-08-15 MED ORDER — HEPARIN (PORCINE) IN NACL 1000-0.9 UT/500ML-% IV SOLN
INTRAVENOUS | Status: AC
  Filled 2018-08-15: qty 1000

## 2018-08-15 MED ORDER — HEPARIN SODIUM (PORCINE) 1000 UNIT/ML IJ SOLN
INTRAMUSCULAR | Status: DC | PRN
  Administered 2018-08-15: 3000 [IU] via INTRAVENOUS

## 2018-08-15 MED ORDER — SODIUM CHLORIDE 0.9 % WEIGHT BASED INFUSION: 1.0000 mL/kg/h | INTRAVENOUS | Status: DC

## 2018-08-15 MED ORDER — SODIUM CHLORIDE 0.9% FLUSH: 3.0000 mL | Freq: Two times a day (BID) | INTRAVENOUS | Status: DC

## 2018-08-15 MED ORDER — FENTANYL CITRATE (PF) 100 MCG/2ML IJ SOLN
INTRAMUSCULAR | Status: DC | PRN
  Administered 2018-08-15: 25 ug via INTRAVENOUS

## 2018-08-15 MED ORDER — HYDRALAZINE HCL 20 MG/ML IJ SOLN: 10.0000 mg | INTRAMUSCULAR | Status: DC | PRN

## 2018-08-15 MED ORDER — SODIUM CHLORIDE 0.9 % IV SOLN: 250.0000 mL | INTRAVENOUS | Status: DC | PRN

## 2018-08-15 MED ORDER — SODIUM CHLORIDE 0.9 % WEIGHT BASED INFUSION
3.0000 mL/kg/h | INTRAVENOUS | Status: AC
  Administered 2018-08-15: 3 mL/kg/h via INTRAVENOUS

## 2018-08-15 MED ORDER — ONDANSETRON HCL 4 MG/2ML IJ SOLN: 4.0000 mg | Freq: Four times a day (QID) | INTRAMUSCULAR | Status: DC | PRN

## 2018-08-15 MED ORDER — HEPARIN SODIUM (PORCINE) 1000 UNIT/ML IJ SOLN
INTRAMUSCULAR | Status: AC
  Filled 2018-08-15: qty 1

## 2018-08-15 MED ORDER — HEPARIN (PORCINE) IN NACL 1000-0.9 UT/500ML-% IV SOLN
INTRAVENOUS | Status: DC | PRN
  Administered 2018-08-15: 500 mL

## 2018-08-15 MED ORDER — SODIUM CHLORIDE 0.9% FLUSH: 3.0000 mL | INTRAVENOUS | Status: DC | PRN

## 2018-08-15 MED ORDER — FENTANYL CITRATE (PF) 100 MCG/2ML IJ SOLN
INTRAMUSCULAR | Status: AC
  Filled 2018-08-15: qty 2

## 2018-08-15 MED ORDER — MIDAZOLAM HCL 2 MG/2ML IJ SOLN
INTRAMUSCULAR | Status: DC | PRN
  Administered 2018-08-15: 0.5 mg via INTRAVENOUS

## 2018-08-15 MED ORDER — ACETAMINOPHEN 325 MG PO TABS: 650.0000 mg | ORAL_TABLET | ORAL | Status: DC | PRN

## 2018-08-15 MED ORDER — MIDAZOLAM HCL 2 MG/2ML IJ SOLN
INTRAMUSCULAR | Status: AC
  Filled 2018-08-15: qty 2

## 2018-08-15 SURGICAL SUPPLY — 9 items
CATH INFINITI 5 FR JL3.5 (CATHETERS) ×3 IMPLANT
CATH INFINITI 5FR ANG PIGTAIL (CATHETERS) ×3 IMPLANT
CATH INFINITI 5FR JL5 (CATHETERS) ×3 IMPLANT
CATH INFINITI JR4 5F (CATHETERS) ×3 IMPLANT
DEVICE RAD TR BAND REGULAR (VASCULAR PRODUCTS) ×3 IMPLANT
GLIDESHEATH SLEND SS 6F .021 (SHEATH) ×3 IMPLANT
KIT MANI 3VAL PERCEP (MISCELLANEOUS) ×3 IMPLANT
PACK CARDIAC CATH (CUSTOM PROCEDURE TRAY) ×3 IMPLANT
WIRE ROSEN-J .035X260CM (WIRE) ×3 IMPLANT

## 2018-08-15 NOTE — Discharge Instructions (Signed)
Radial Site Care ° °This sheet gives you information about how to care for yourself after your procedure. Your health care provider may also give you more specific instructions. If you have problems or questions, contact your health care provider. °What can I expect after the procedure? °After the procedure, it is common to have: °· Bruising and tenderness at the catheter insertion area. °Follow these instructions at home: °Medicines °· Take over-the-counter and prescription medicines only as told by your health care provider. °Insertion site care °· Follow instructions from your health care provider about how to take care of your insertion site. Make sure you: °? Wash your hands with soap and water before you change your bandage (dressing). If soap and water are not available, use hand sanitizer. °? Change your dressing as told by your health care provider. °? Leave stitches (sutures), skin glue, or adhesive strips in place. These skin closures may need to stay in place for 2 weeks or longer. If adhesive strip edges start to loosen and curl up, you may trim the loose edges. Do not remove adhesive strips completely unless your health care provider tells you to do that. °· Check your insertion site every day for signs of infection. Check for: °? Redness, swelling, or pain. °? Fluid or blood. °? Pus or a bad smell. °? Warmth. °· Do not take baths, swim, or use a hot tub until your health care provider approves. °· You may shower 24-48 hours after the procedure, or as directed by your health care provider. °? Remove the dressing and gently wash the site with plain soap and water. °? Pat the area dry with a clean towel. °? Do not rub the site. That could cause bleeding. °· Do not apply powder or lotion to the site. °Activity ° °· For 24 hours after the procedure, or as directed by your health care provider: °? Do not flex or bend the affected arm. °? Do not push or pull heavy objects with the affected arm. °? Do not  drive yourself home from the hospital or clinic. You may drive 24 hours after the procedure unless your health care provider tells you not to. °? Do not operate machinery or power tools. °· Do not lift anything that is heavier than 10 lb (4.5 kg), or the limit that you are told, until your health care provider says that it is safe. °· Ask your health care provider when it is okay to: °? Return to work or school. °? Resume usual physical activities or sports. °? Resume sexual activity. °General instructions °· If the catheter site starts to bleed, raise your arm and put firm pressure on the site. If the bleeding does not stop, get help right away. This is a medical emergency. °· If you went home on the same day as your procedure, a responsible adult should be with you for the first 24 hours after you arrive home. °· Keep all follow-up visits as told by your health care provider. This is important. °Contact a health care provider if: °· You have a fever. °· You have redness, swelling, or yellow drainage around your insertion site. °Get help right away if: °· You have unusual pain at the radial site. °· The catheter insertion area swells very fast. °· The insertion area is bleeding, and the bleeding does not stop when you hold steady pressure on the area. °· Your arm or hand becomes pale, cool, tingly, or numb. °These symptoms may represent a serious problem   that is an emergency. Do not wait to see if the symptoms will go away. Get medical help right away. Call your local emergency services (911 in the [place] Do not drive yourself to the hospital. Summary  After the procedure, it is common to have bruising and tenderness at the site.  Follow instructions from your health care provider about how to take care of your radial site wound. Check the wound every day for signs of infection.  Do not lift anything that is heavier than 10 lb (4.5 kg), or the limit that you are told, until your health care provider says  that it is safe. This information is not intended to replace advice given to you by your health care provider. Make sure you discuss any questions you have with your health care provider. Document Released: 04/03/2010 Document Revised: 04/06/2017 Document Reviewed: 04/06/2017 Elsevier Interactive Patient Education  2019 [place] After This sheet gives you information about how to care for yourself after your procedure. Your doctor may also give you more specific instructions. If you have problems or questions, contact your doctor. Follow these instructions at home: Insertion site care  Follow instructions from your doctor about how to take care of your long, thin tube (catheter) insertion area. Make sure you: ? Wash your hands with soap and water before you change your bandage (dressing). If you cannot use soap and water, use hand sanitizer. ? Change your bandage as told by your doctor. ? Leave stitches (sutures), skin glue, or skin tape (adhesive) strips in place. They may need to stay in place for 2 weeks or longer. If tape strips get loose and curl up, you may trim the loose edges. Do not remove tape strips completely unless your doctor says it is okay.  Do not take baths, swim, or use a hot tub until your doctor says it is okay.  You may shower 24-48 hours after the procedure or as told by your doctor. ? Gently wash the area with plain soap and water. ? Pat the area dry with a clean towel. ? Do not rub the area. This may cause bleeding.  Do not apply powder or lotion to the area. Keep the area clean and dry.  Check your insertion area every day for signs of infection. Check for: ? More redness, swelling, or pain. ? Fluid or blood. ? Warmth. ? Pus or a bad smell. Activity  Rest as told by your doctor, usually for 1-2 days.  Do not lift anything that is heavier than 10 lbs. (4.5 kg) or as told by your doctor.  Do not drive for 24 hours if you were given a  medicine to help you relax (sedative).  Do not drive or use heavy machinery while taking prescription pain medicine. General instructions   Go back to your normal activities as told by your doctor, usually in about a week. Ask your doctor what activities are safe for you.  If the insertion area starts to bleed, lie flat and put pressure on the area. If the bleeding does not stop, get help right away. This is an emergency.  Drink enough fluid to keep your pee (urine) clear or pale yellow.  Take over-the-counter and prescription medicines only as told by your doctor.  Keep all follow-up visits as told by your doctor. This is important. Contact a doctor if:  You have a fever.  You have chills.  You have more redness, swelling, or pain around your insertion  area.  You have fluid or blood coming from your insertion area.  The insertion area feels warm to the touch.  You have pus or a bad smell coming from your insertion area.  You have more bruising around the insertion area.  Blood collects in the tissue around the insertion area (hematoma) that may be painful to the touch. Get help right away if:  You have a lot of pain in the insertion area.  The insertion area swells very fast.  The insertion area is bleeding, and the bleeding does not stop after holding steady pressure on the area.  The area near or just beyond the insertion area becomes pale, cool, tingly, or numb. These symptoms may be an emergency. Do not wait to see if the symptoms will go away. Get medical help right away. Call your local emergency services (911 in the [place] Do not drive yourself to the hospital. Summary  After the procedure, it is common to have bruising and tenderness at the long, thin tube insertion area.  After the procedure, it is important to rest and drink plenty of fluids.  Do not take baths, swim, or use a hot tub until your doctor says it is okay to do so. You may shower 24-48 hours  after the procedure or as told by your doctor.  If the insertion area starts to bleed, lie flat and put pressure on the area. If the bleeding does not stop, get help right away. This is an emergency. This information is not intended to replace advice given to you by your health care provider. Make sure you discuss any questions you have with your health care provider. Document Released: 05/28/2008 Document Revised: 02/24/2016 Document Reviewed: 02/24/2016 Elsevier Interactive Patient Education  2019 [employer].   Moderate Conscious Sedation, Adult, Care After These instructions provide you with information about caring for yourself after your procedure. Your health care provider may also give you more specific instructions. Your treatment has been planned according to current medical practices, but problems sometimes occur. Call your health care provider if you have any problems or questions after your procedure. What can I expect after the procedure? After your procedure, it is common:  To feel sleepy for several hours.  To feel clumsy and have poor balance for several hours.  To have poor judgment for several hours.  To vomit if you eat too soon. Follow these instructions at home: For at least 24 hours after the procedure:   Do not: ? Participate in activities where you could fall or become injured. ? Drive. ? Use heavy machinery. ? Drink alcohol. ? Take sleeping pills or medicines that cause drowsiness. ? Make important decisions or sign legal documents. ? Take care of children on your own.  Rest. Eating and drinking  Follow the diet recommended by your health care provider.  If you vomit: ? Drink water, juice, or soup when you can drink without vomiting. ? Make sure you have little or no nausea before eating solid foods. General instructions  Have a responsible adult stay with you until you are awake and alert.  Take over-the-counter and prescription medicines only  as told by your health care provider.  If you smoke, do not smoke without supervision.  Keep all follow-up visits as told by your health care provider. This is important. Contact a health care provider if:  You keep feeling nauseous or you keep vomiting.  You feel light-headed.  You develop a rash.  You have a  fever. Get help right away if:  You have trouble breathing. This information is not intended to replace advice given to you by your health care provider. Make sure you discuss any questions you have with your health care provider. Document Released: 12/20/2012 Document Revised: 08/04/2015 Document Reviewed: 06/21/2015 Elsevier Interactive Patient Education  2019 [employer].

## 2020-10-30 ENCOUNTER — Ambulatory Visit
Admission: RE | Admit: 2020-10-30 | Discharge: 2020-10-30 | Disposition: A | Discharge status: Home / Self Care | Payer: Medicare Other | Attending: Cardiology | Admitting: Cardiology

## 2020-10-30 ENCOUNTER — Encounter: Payer: Self-pay | Admitting: Cardiology

## 2020-10-30 ENCOUNTER — Other Ambulatory Visit: Payer: Self-pay

## 2020-10-30 ENCOUNTER — Encounter
Admission: RE | Disposition: A | Discharge status: Home / Self Care | Payer: Self-pay | Source: Home / Self Care | Attending: Cardiology

## 2020-10-30 DIAGNOSIS — I25118 Atherosclerotic heart disease of native coronary artery with other forms of angina pectoris: Secondary | ICD-10-CM | POA: Diagnosis not present

## 2020-10-30 DIAGNOSIS — R9430 Abnormal result of cardiovascular function study, unspecified: Secondary | ICD-10-CM | POA: Diagnosis present

## 2020-10-30 DIAGNOSIS — Z7982 Long term (current) use of aspirin: Secondary | ICD-10-CM | POA: Diagnosis not present

## 2020-10-30 DIAGNOSIS — Z955 Presence of coronary angioplasty implant and graft: Secondary | ICD-10-CM | POA: Insufficient documentation

## 2020-10-30 DIAGNOSIS — R079 Chest pain, unspecified: Secondary | ICD-10-CM

## 2020-10-30 HISTORY — PX: INTRAVASCULAR PRESSURE WIRE/FFR STUDY: CATH118243

## 2020-10-30 HISTORY — PX: LEFT HEART CATH AND CORONARY ANGIOGRAPHY: CATH118249

## 2020-10-30 LAB — POCT ACTIVATED CLOTTING TIME: Activated Clotting Time: 300 seconds

## 2020-10-30 SURGERY — LEFT HEART CATH AND CORONARY ANGIOGRAPHY
Anesthesia: Moderate Sedation

## 2020-10-30 MED ORDER — ONDANSETRON HCL 4 MG/2ML IJ SOLN: 4.0000 mg | Freq: Four times a day (QID) | INTRAMUSCULAR | Status: DC | PRN

## 2020-10-30 MED ORDER — ASPIRIN 81 MG PO CHEW: 81.0000 mg | CHEWABLE_TABLET | ORAL | Status: DC

## 2020-10-30 MED ORDER — SODIUM CHLORIDE 0.9 % IV SOLN: 250.0000 mL | INTRAVENOUS | Status: DC | PRN

## 2020-10-30 MED ORDER — SODIUM CHLORIDE 0.9% FLUSH: 3.0000 mL | Freq: Two times a day (BID) | INTRAVENOUS | Status: DC

## 2020-10-30 MED ORDER — SODIUM CHLORIDE 0.9% FLUSH: 3.0000 mL | INTRAVENOUS | Status: DC | PRN

## 2020-10-30 MED ORDER — VERAPAMIL HCL 2.5 MG/ML IV SOLN
INTRAVENOUS | Status: AC
  Filled 2020-10-30: qty 2

## 2020-10-30 MED ORDER — SODIUM CHLORIDE 0.9 % IV SOLN: INTRAVENOUS | Status: DC

## 2020-10-30 MED ORDER — SODIUM CHLORIDE 0.9 % IV SOLN
250.0000 mL | INTRAVENOUS | Status: DC | PRN
Start: 2020-10-30 — End: 2020-10-30

## 2020-10-30 MED ORDER — SODIUM CHLORIDE 0.9 % WEIGHT BASED INFUSION
1.0000 mL/kg/h | INTRAVENOUS | Status: DC
  Administered 2020-10-30: 1 mL/kg/h via INTRAVENOUS

## 2020-10-30 MED ORDER — MIDAZOLAM HCL 2 MG/2ML IJ SOLN
INTRAMUSCULAR | Status: DC | PRN
  Administered 2020-10-30: 1 mg via INTRAVENOUS

## 2020-10-30 MED ORDER — LIDOCAINE HCL 1 % IJ SOLN
INTRAMUSCULAR | Status: AC
  Filled 2020-10-30: qty 20

## 2020-10-30 MED ORDER — FENTANYL CITRATE (PF) 100 MCG/2ML IJ SOLN
INTRAMUSCULAR | Status: DC | PRN
  Administered 2020-10-30: 25 ug via INTRAVENOUS

## 2020-10-30 MED ORDER — HEPARIN SODIUM (PORCINE) 1000 UNIT/ML IJ SOLN
INTRAMUSCULAR | Status: DC | PRN
  Administered 2020-10-30 (×2): 3500 [IU] via INTRAVENOUS

## 2020-10-30 MED ORDER — LIDOCAINE HCL (PF) 1 % IJ SOLN
INTRAMUSCULAR | Status: DC | PRN
  Administered 2020-10-30: 2 mL

## 2020-10-30 MED ORDER — HEPARIN (PORCINE) IN NACL 1000-0.9 UT/500ML-% IV SOLN
INTRAVENOUS | Status: DC | PRN
Start: 2020-10-30 — End: 2020-10-30
  Administered 2020-10-30 (×2): 500 mL

## 2020-10-30 MED ORDER — ASPIRIN 81 MG PO CHEW: 81.0000 mg | CHEWABLE_TABLET | ORAL | Status: AC

## 2020-10-30 MED ORDER — HEPARIN (PORCINE) IN NACL 1000-0.9 UT/500ML-% IV SOLN
INTRAVENOUS | Status: AC
  Filled 2020-10-30: qty 1000

## 2020-10-30 MED ORDER — ASPIRIN 81 MG PO CHEW
CHEWABLE_TABLET | ORAL | Status: AC
  Administered 2020-10-30: 81 mg via ORAL
  Filled 2020-10-30: qty 1

## 2020-10-30 MED ORDER — ACETAMINOPHEN 325 MG PO TABS: 650.0000 mg | ORAL_TABLET | ORAL | Status: DC | PRN

## 2020-10-30 MED ORDER — ISOSORBIDE MONONITRATE ER 120 MG PO TB24: 120.0000 mg | ORAL_TABLET | Freq: Every day | ORAL | 3 refills | Status: DC

## 2020-10-30 MED ORDER — IOHEXOL 300 MG/ML  SOLN
INTRAMUSCULAR | Status: DC | PRN
  Administered 2020-10-30: 83 mL

## 2020-10-30 MED ORDER — SODIUM CHLORIDE 0.9 % WEIGHT BASED INFUSION: 3.0000 mL/kg/h | INTRAVENOUS | Status: DC

## 2020-10-30 MED ORDER — FENTANYL CITRATE (PF) 100 MCG/2ML IJ SOLN
INTRAMUSCULAR | Status: AC
  Filled 2020-10-30: qty 2

## 2020-10-30 MED ORDER — MIDAZOLAM HCL 2 MG/2ML IJ SOLN
INTRAMUSCULAR | Status: AC
  Filled 2020-10-30: qty 2

## 2020-10-30 MED ORDER — VERAPAMIL HCL 2.5 MG/ML IV SOLN
INTRAVENOUS | Status: DC | PRN
  Administered 2020-10-30: 2.5 mg via INTRA_ARTERIAL

## 2020-10-30 MED ORDER — HEPARIN SODIUM (PORCINE) 1000 UNIT/ML IJ SOLN
INTRAMUSCULAR | Status: AC
  Filled 2020-10-30: qty 1

## 2020-10-30 MED ORDER — SODIUM CHLORIDE 0.9 % WEIGHT BASED INFUSION: 1.0000 mL/kg/h | INTRAVENOUS | Status: DC

## 2020-10-30 SURGICAL SUPPLY — 15 items
CATH INFINITI 5FR JL5 (CATHETERS) ×2 IMPLANT
CATH INFINITI JR4 5F (CATHETERS) ×2 IMPLANT
CATH VISTA GUIDE 6FR JR4 (CATHETERS) ×2 IMPLANT
DEVICE RAD TR BAND REGULAR (VASCULAR PRODUCTS) ×2 IMPLANT
DRAPE BRACHIAL (DRAPES) ×2 IMPLANT
GLIDESHEATH SLEND SS 6F .021 (SHEATH) ×2 IMPLANT
GUIDEWIRE INQWIRE 1.5J.035X260 (WIRE) ×1 IMPLANT
GUIDEWIRE PRESS OMNI 185 ST (WIRE) ×2 IMPLANT
INQWIRE 1.5J .035X260CM (WIRE) ×2
PACK CARDIAC CATH (CUSTOM PROCEDURE TRAY) ×2 IMPLANT
PROTECTION STATION PRESSURIZED (MISCELLANEOUS) ×2
SET ATX SIMPLICITY (MISCELLANEOUS) ×2 IMPLANT
STATION PROTECTION PRESSURIZED (MISCELLANEOUS) ×1 IMPLANT
TUBING CIL FLEX 10 FLL-RA (TUBING) ×2 IMPLANT
VALVE COPILOT STAT (MISCELLANEOUS) ×2 IMPLANT

## 2020-10-30 NOTE — Progress Notes (Signed)
MD aware that CBC/chem profile and EKG are from 09/22/20 and 09/23/20. No new orders

## 2020-12-18 ENCOUNTER — Other Ambulatory Visit: Payer: Self-pay | Admitting: Infectious Diseases

## 2020-12-18 ENCOUNTER — Ambulatory Visit
Admission: RE | Admit: 2020-12-18 | Discharge: 2020-12-18 | Disposition: A | Discharge status: Home / Self Care | Payer: Medicare Other | Source: Ambulatory Visit | Attending: Infectious Diseases | Admitting: Infectious Diseases

## 2020-12-18 ENCOUNTER — Other Ambulatory Visit: Payer: Self-pay

## 2020-12-18 DIAGNOSIS — R42 Dizziness and giddiness: Secondary | ICD-10-CM | POA: Diagnosis not present

## 2020-12-18 DIAGNOSIS — R001 Bradycardia, unspecified: Secondary | ICD-10-CM

## 2020-12-18 DIAGNOSIS — R930 Abnormal findings on diagnostic imaging of skull and head, not elsewhere classified: Secondary | ICD-10-CM

## 2020-12-28 ENCOUNTER — Ambulatory Visit
Admission: RE | Admit: 2020-12-28 | Discharge: 2020-12-28 | Disposition: A | Discharge status: Home / Self Care | Payer: Medicare Other | Source: Ambulatory Visit | Attending: Infectious Diseases | Admitting: Infectious Diseases

## 2020-12-28 ENCOUNTER — Other Ambulatory Visit: Payer: Self-pay

## 2020-12-28 DIAGNOSIS — R930 Abnormal findings on diagnostic imaging of skull and head, not elsewhere classified: Secondary | ICD-10-CM | POA: Insufficient documentation

## 2020-12-28 DIAGNOSIS — R42 Dizziness and giddiness: Secondary | ICD-10-CM | POA: Diagnosis not present

## 2020-12-28 MED ORDER — GADOBUTROL 1 MMOL/ML IV SOLN
7.0000 mL | Freq: Once | INTRAVENOUS | Status: AC | PRN
  Administered 2020-12-28: 7 mL via INTRAVENOUS

## 2022-03-27 ENCOUNTER — Emergency Department: Payer: Medicare Other

## 2022-03-27 ENCOUNTER — Emergency Department
Admission: EM | Admit: 2022-03-27 | Discharge: 2022-03-28 | Disposition: A | Discharge status: Home / Self Care | Payer: Medicare Other | Attending: Emergency Medicine | Admitting: Emergency Medicine

## 2022-03-27 DIAGNOSIS — I2489 Other forms of acute ischemic heart disease: Secondary | ICD-10-CM

## 2022-03-27 DIAGNOSIS — R0600 Dyspnea, unspecified: Secondary | ICD-10-CM | POA: Insufficient documentation

## 2022-03-27 DIAGNOSIS — I4891 Unspecified atrial fibrillation: Secondary | ICD-10-CM | POA: Insufficient documentation

## 2022-03-27 LAB — CBC WITH DIFFERENTIAL/PLATELET
Abs Immature Granulocytes: 0.02 10*3/uL (ref 0.00–0.07)
Basophils Absolute: 0 10*3/uL (ref 0.0–0.1)
Basophils Relative: 1 %
Eosinophils Absolute: 0.1 10*3/uL (ref 0.0–0.5)
Eosinophils Relative: 2 %
HCT: 44.8 % (ref 39.0–52.0)
Hemoglobin: 14.7 g/dL (ref 13.0–17.0)
Immature Granulocytes: 0 %
Lymphocytes Relative: 19 %
Lymphs Abs: 1.1 10*3/uL (ref 0.7–4.0)
MCH: 31.8 pg (ref 26.0–34.0)
MCHC: 32.8 g/dL (ref 30.0–36.0)
MCV: 97 fL (ref 80.0–100.0)
Monocytes Absolute: 0.7 10*3/uL (ref 0.1–1.0)
Monocytes Relative: 13 %
Neutro Abs: 3.5 10*3/uL (ref 1.7–7.7)
Neutrophils Relative %: 65 %
Platelets: 163 10*3/uL (ref 150–400)
RBC: 4.62 MIL/uL (ref 4.22–5.81)
RDW: 12.3 % (ref 11.5–15.5)
WBC: 5.5 10*3/uL (ref 4.0–10.5)
nRBC: 0 % (ref 0.0–0.2)

## 2022-03-27 LAB — TROPONIN I (HIGH SENSITIVITY)
Troponin I (High Sensitivity): 252 ng/L (ref <18)
Troponin I (High Sensitivity): 380 ng/L (ref <18)

## 2022-03-27 LAB — BASIC METABOLIC PANEL
Anion gap: 9 (ref 5–15)
BUN: 18 mg/dL (ref 8–23)
CO2: 28 mmol/L (ref 22–32)
Calcium: 9.5 mg/dL (ref 8.9–10.3)
Chloride: 104 mmol/L (ref 98–111)
Creatinine, Ser: 1 mg/dL (ref 0.61–1.24)
GFR, Estimated: >60 mL/min (ref >60)
Glucose, Bld: 121 mg/dL — ABNORMAL HIGH (ref 70–99)
Potassium: 4.1 mmol/L (ref 3.5–5.1)
Sodium: 141 mmol/L (ref 135–145)

## 2022-03-27 MED ORDER — METOPROLOL SUCCINATE ER 50 MG PO TB24: 50.0000 mg | ORAL_TABLET | Freq: Every day | ORAL | 1 refills | Status: AC

## 2022-03-27 MED ORDER — APIXABAN (ELIQUIS) VTE STARTER PACK (10MG AND 5MG): ORAL_TABLET | ORAL | 0 refills | Status: DC

## 2022-03-27 NOTE — ED Notes (Signed)
Dr. Kerman Passey informed that the pts trop came back at 60

## 2022-03-27 NOTE — ED Triage Notes (Signed)
Patient was using his pulse ox at home and observed that the waves on the pulse ox were fast and irregular so he went to the fire station to "get checked out" and the FD called EMS; 12-lead showed that patient was in A-fib RVR; Patient was asymptomatic and converted on his own en route to the hospital; He has no pain or shortness of breath, states that he feels fine, and the only thing that made him want to get "checked out" was the abnormal wave form on his pulse ox monitor; EKG here shows sinus rhythm

## 2022-03-27 NOTE — ED Notes (Signed)
2nd trop drawn and sent. Vitals stable. Pt denies any chest pain, palpitations, dizziness or lightheadedness since he's been here.

## 2022-03-27 NOTE — ED Notes (Signed)
Called lab to see if they received any blood and they have not. Blood redrawn, dr. Kerman Passey aware.

## 2022-03-27 NOTE — Discharge Instructions (Addendum)
As we discussed your workup today is most consistent with atrial fibrillation that is now returned to a normal sinus rhythm.  Please begin taking your blood thinner as prescribed.  Your metoprolol has been increased from 25 mg daily to now 50 mg daily.  I have provided a new prescription for 50 mg tablets, please take this instead of your 25 mg tablets.  Also please discontinue use of your aspirin as you are now on a blood thinner.  Please call the number for cardiology Tuesday morning to arrange an appointment as soon as possible.  Return to the emergency department for any chest pain any shortness of breath or any rapid heartbeat.

## 2022-03-27 NOTE — ED Provider Notes (Signed)
[facility] Provider Note    Event Date/Time   First MD Initiated Contact with Patient 03/27/22 1955     (approximate)  History   Chief Complaint: Irregular Heart Beat (Patient was using his pulse ox at home and observed that the waves on the pulse ox were fast and irregular so he went to the fire station to "get checked out" and the FD called EMS; 12-lead showed that patient was in A-fib RVR; Patient was asymptomatic and converted on his own en route to the hospital; He has no pain or shortness of breath, states that he feels fine, and the only thing that made him want to get "checked out" was the abnormal wave form on his pulse ox monitor; EKG here shows sinus rhythm)  HPI  Hunter Carey is a 87 y.o. male with a past medical history of ED status post stent, follows up with Dr. Saralyn Pilar of cardiology, COPD, hypertension, hyperlipidemia presents to the emergency department for shortness of breath and chest tightness.  According to the patient around 2 PM today he developed some chest tightness and shortness of breath.  Patient states he went up to the fire department and they did a an EKG showing atrial fibrillation the patient came to the emergency department.  Patient denies any history of atrial fibrillation.  Patient takes 81 mg aspirin as his only anticoagulant.  Patient states since arriving to the emergency department he has no symptoms.  Denies any shortness of breath or discomfort.  Patient is now in a normal sinus rhythm.  Physical Exam   Triage Vital Signs: ED Triage Vitals  Enc Vitals Group     BP 03/27/22 1850 131/78     Pulse Rate 03/27/22 1850 87     Resp 03/27/22 1850 20     Temp 03/27/22 1850 (!) 97.5 F (36.4 C)     Temp Source 03/27/22 1850 Oral     SpO2 03/27/22 1850 98 %     Weight --      Height --      Head Circumference --      Peak Flow --      Pain Score 03/27/22 1852 0     Pain Loc --      Pain Edu? --      Excl. in [place]? --      Most recent vital signs: Vitals:   03/27/22 1850  BP: 131/78  Pulse: 87  Resp: 20  Temp: (!) 97.5 F (36.4 C)  SpO2: 98%    General: Awake, no distress.  CV:  Good peripheral perfusion.  Regular rate and rhythm  Resp:  Normal effort.  Equal breath sounds bilaterally.  Abd:  No distention.  Soft, nontender.  No rebound or guarding.   ED Results / Procedures / Treatments   EKG  EKG viewed and interpreted by myself shows a normal sinus rhythm at 90 bpm with a narrow QRS, normal axis, normal intervals, no concerning ST changes.  RADIOLOGY  Patient's chest x-ray viewed and interpreted by myself shows no consolidation. Radiology is read the chest x-ray is COPD without acute process.   MEDICATIONS ORDERED IN ED: Medications - No data to display   IMPRESSION / MDM / [place] / ED COURSE  I reviewed the triage vital signs and the nursing notes.  Patient's presentation is most consistent with acute presentation with potential threat to life or bodily function.  Patient presents emergency department for shortness of breath  found to have a rapid heart rate per report consistent atrial fibrillation with rapid ventricular response.  So far been unable to track down this rhythm strip.  Here the patient remains in a normal sinus rhythm with reassuring vital signs.  Patient's chest x-ray is clear and EKG confirms normal sinus rhythm.  Patient denies any symptoms whatsoever currently.  It is definitely possible the patient is experiencing paroxysmal atrial fibrillation.  Patient follows up with cardiology.  We will check labs we will continue to closely monitor.  Patient agreeable to plan.  I have evaluated EMS's rhythm strip.  Patient appears to have been in atrial fibrillation with rapid ventricular response.    I spoke with Dr. Clayborn Bigness of cardiology who agrees patient should be started on Eliquis as well as increased metoprolol.  Patient is currently taking 25 mg of  metoprolol succinate we will increase this to 50 mg of metoprolol succinate.  Patient's repeat troponin is increased however again most consistent with demand ischemia patient denies any symptoms currently.  Remains in normal sinus rhythm.  We will discharge the patient home have the patient follow-up with cardiology.  FINAL CLINICAL IMPRESSION(S) / ED DIAGNOSES   Dyspnea    Note:  This document was prepared using Dragon voice recognition software and may include unintentional dictation errors.   Harvest Dark, MD 03/27/22 [phone]

## 2022-08-31 ENCOUNTER — Encounter: Payer: Self-pay | Admitting: Cardiology

## 2022-08-31 ENCOUNTER — Observation Stay: Payer: Medicare Other

## 2022-08-31 ENCOUNTER — Observation Stay
Admission: RE | Admit: 2022-08-31 | Discharge: 2022-09-01 | Disposition: A | Discharge status: Home / Self Care | Payer: Medicare Other | Attending: Cardiology | Admitting: Cardiology

## 2022-08-31 ENCOUNTER — Other Ambulatory Visit: Payer: Self-pay

## 2022-08-31 ENCOUNTER — Encounter
Admission: RE | Disposition: A | Discharge status: Home / Self Care | Payer: Self-pay | Source: Home / Self Care | Attending: Cardiology

## 2022-08-31 DIAGNOSIS — Z7901 Long term (current) use of anticoagulants: Secondary | ICD-10-CM | POA: Insufficient documentation

## 2022-08-31 DIAGNOSIS — Z87891 Personal history of nicotine dependence: Secondary | ICD-10-CM | POA: Insufficient documentation

## 2022-08-31 DIAGNOSIS — I10 Essential (primary) hypertension: Secondary | ICD-10-CM | POA: Insufficient documentation

## 2022-08-31 DIAGNOSIS — Z79899 Other long term (current) drug therapy: Secondary | ICD-10-CM | POA: Diagnosis not present

## 2022-08-31 DIAGNOSIS — Z7982 Long term (current) use of aspirin: Secondary | ICD-10-CM | POA: Insufficient documentation

## 2022-08-31 DIAGNOSIS — R079 Chest pain, unspecified: Secondary | ICD-10-CM | POA: Diagnosis not present

## 2022-08-31 DIAGNOSIS — I2510 Atherosclerotic heart disease of native coronary artery without angina pectoris: Secondary | ICD-10-CM | POA: Insufficient documentation

## 2022-08-31 DIAGNOSIS — Z8673 Personal history of transient ischemic attack (TIA), and cerebral infarction without residual deficits: Secondary | ICD-10-CM | POA: Diagnosis not present

## 2022-08-31 DIAGNOSIS — J449 Chronic obstructive pulmonary disease, unspecified: Secondary | ICD-10-CM | POA: Diagnosis not present

## 2022-08-31 DIAGNOSIS — I495 Sick sinus syndrome: Principal | ICD-10-CM | POA: Diagnosis present

## 2022-08-31 DIAGNOSIS — Z955 Presence of coronary angioplasty implant and graft: Secondary | ICD-10-CM | POA: Diagnosis not present

## 2022-08-31 DIAGNOSIS — I480 Paroxysmal atrial fibrillation: Secondary | ICD-10-CM | POA: Diagnosis not present

## 2022-08-31 HISTORY — PX: PACEMAKER IMPLANT: EP1218

## 2022-08-31 LAB — TROPONIN I (HIGH SENSITIVITY)
Troponin I (High Sensitivity): 123 ng/L (ref <18)
Troponin I (High Sensitivity): 124 ng/L (ref <18)

## 2022-08-31 SURGERY — PACEMAKER IMPLANT
Anesthesia: Moderate Sedation

## 2022-08-31 MED ORDER — CEFAZOLIN SODIUM-DEXTROSE 1-4 GM/50ML-% IV SOLN
INTRAVENOUS | Status: AC
  Filled 2022-08-31: qty 50

## 2022-08-31 MED ORDER — NITROGLYCERIN 0.4 MG SL SUBL
0.4000 mg | SUBLINGUAL_TABLET | SUBLINGUAL | Status: AC | PRN
  Administered 2022-08-31 (×3): 0.4 mg via SUBLINGUAL

## 2022-08-31 MED ORDER — MIDAZOLAM HCL 2 MG/2ML IJ SOLN
INTRAMUSCULAR | Status: DC | PRN
  Administered 2022-08-31: 1 mg via INTRAVENOUS

## 2022-08-31 MED ORDER — SIMVASTATIN 20 MG PO TABS
10.0000 mg | ORAL_TABLET | Freq: Every day | ORAL | Status: DC
  Administered 2022-08-31: 10 mg via ORAL
  Filled 2022-08-31: qty 1

## 2022-08-31 MED ORDER — SODIUM CHLORIDE 0.9 % IV SOLN: INTRAVENOUS | Status: DC

## 2022-08-31 MED ORDER — IOHEXOL 300 MG/ML  SOLN
INTRAMUSCULAR | Status: DC | PRN
  Administered 2022-08-31: 30 mL

## 2022-08-31 MED ORDER — LIDOCAINE HCL 1 % IJ SOLN
INTRAMUSCULAR | Status: AC
  Filled 2022-08-31: qty 60

## 2022-08-31 MED ORDER — MIDAZOLAM HCL 2 MG/2ML IJ SOLN
INTRAMUSCULAR | Status: AC
  Filled 2022-08-31: qty 2

## 2022-08-31 MED ORDER — FENTANYL CITRATE (PF) 100 MCG/2ML IJ SOLN
INTRAMUSCULAR | Status: DC | PRN
  Administered 2022-08-31: 25 ug via INTRAVENOUS

## 2022-08-31 MED ORDER — SODIUM CHLORIDE 0.9 % IV SOLN
80.0000 mg | INTRAVENOUS | Status: AC
  Administered 2022-08-31: 80 mg
  Filled 2022-08-31: qty 2

## 2022-08-31 MED ORDER — HYDROCHLOROTHIAZIDE 12.5 MG PO TABS
12.5000 mg | ORAL_TABLET | Freq: Every day | ORAL | Status: DC
  Administered 2022-08-31 – 2022-09-01 (×2): 12.5 mg via ORAL
  Filled 2022-08-31 (×4): qty 1

## 2022-08-31 MED ORDER — CEFAZOLIN SODIUM-DEXTROSE 2-4 GM/100ML-% IV SOLN
INTRAVENOUS | Status: AC
  Filled 2022-08-31: qty 100

## 2022-08-31 MED ORDER — CEFAZOLIN SODIUM-DEXTROSE 1-4 GM/50ML-% IV SOLN
1.0000 g | Freq: Four times a day (QID) | INTRAVENOUS | Status: AC
  Administered 2022-08-31 – 2022-09-01 (×3): 1 g via INTRAVENOUS
  Filled 2022-08-31 (×3): qty 50

## 2022-08-31 MED ORDER — ACETAMINOPHEN 325 MG PO TABS: 325.0000 mg | ORAL_TABLET | ORAL | Status: DC | PRN

## 2022-08-31 MED ORDER — HEPARIN (PORCINE) IN NACL 1000-0.9 UT/500ML-% IV SOLN
INTRAVENOUS | Status: DC | PRN
  Administered 2022-08-31: 500 mL

## 2022-08-31 MED ORDER — ONDANSETRON HCL 4 MG/2ML IJ SOLN
INTRAMUSCULAR | Status: AC
  Filled 2022-08-31: qty 2

## 2022-08-31 MED ORDER — NITROGLYCERIN 0.4 MG SL SUBL
SUBLINGUAL_TABLET | SUBLINGUAL | Status: AC
  Filled 2022-08-31: qty 1

## 2022-08-31 MED ORDER — CEFAZOLIN SODIUM-DEXTROSE 2-4 GM/100ML-% IV SOLN
2.0000 g | INTRAVENOUS | Status: AC
  Administered 2022-08-31: 2 g via INTRAVENOUS

## 2022-08-31 MED ORDER — ISOSORBIDE MONONITRATE ER 60 MG PO TB24
60.0000 mg | ORAL_TABLET | Freq: Every day | ORAL | Status: DC
  Administered 2022-08-31 – 2022-09-01 (×2): 60 mg via ORAL
  Filled 2022-08-31 (×2): qty 1

## 2022-08-31 MED ORDER — ONDANSETRON HCL 4 MG/2ML IJ SOLN
4.0000 mg | Freq: Four times a day (QID) | INTRAMUSCULAR | Status: DC | PRN
  Administered 2022-08-31: 4 mg via INTRAVENOUS

## 2022-08-31 MED ORDER — LIDOCAINE HCL (PF) 1 % IJ SOLN
INTRAMUSCULAR | Status: DC | PRN
  Administered 2022-08-31: 40 mL

## 2022-08-31 MED ORDER — RAMIPRIL 5 MG PO CAPS
10.0000 mg | ORAL_CAPSULE | Freq: Every day | ORAL | Status: DC
  Administered 2022-08-31 – 2022-09-01 (×2): 10 mg via ORAL
  Filled 2022-08-31 (×2): qty 2

## 2022-08-31 MED ORDER — KETOROLAC TROMETHAMINE 15 MG/ML IJ SOLN
15.0000 mg | Freq: Once | INTRAMUSCULAR | Status: AC
  Administered 2022-08-31: 15 mg via INTRAVENOUS
  Filled 2022-08-31: qty 1

## 2022-08-31 MED ORDER — METOPROLOL SUCCINATE ER 50 MG PO TB24
50.0000 mg | ORAL_TABLET | Freq: Every day | ORAL | Status: DC
  Administered 2022-09-01: 50 mg via ORAL
  Filled 2022-08-31: qty 1

## 2022-08-31 MED ORDER — FENTANYL CITRATE (PF) 100 MCG/2ML IJ SOLN
INTRAMUSCULAR | Status: AC
  Filled 2022-08-31: qty 2

## 2022-08-31 MED ORDER — METOPROLOL SUCCINATE ER 50 MG PO TB24
ORAL_TABLET | ORAL | Status: AC
  Administered 2022-08-31: 50 mg via ORAL
  Filled 2022-08-31: qty 1

## 2022-08-31 SURGICAL SUPPLY — 18 items
CABLE SURG 12 DISP A/V CHANNEL (MISCELLANEOUS) IMPLANT
DEVICE DSSCT PLSMBLD 3.0S LGHT (MISCELLANEOUS) IMPLANT
DRAPE INCISE 23X17 STRL (DRAPES) IMPLANT
DRAPE INCISE IOBAN 23X17 STRL (DRAPES) ×1 IMPLANT
IPG PACE AZUR XT DR MRI W1DR01 (Pacemaker) IMPLANT
KIT SYRINGE INJ CVI SPIKEX1 (MISCELLANEOUS) IMPLANT
LEAD CAPSURE NOVUS 45CM (Lead) IMPLANT
LEAD CAPSURE NOVUS 5076-58CM (Lead) IMPLANT
PACE AZURE XT DR MRI W1DR01 (Pacemaker) ×1 IMPLANT
PAD ELECT DEFIB RADIOL ZOLL (MISCELLANEOUS) IMPLANT
PLASMABLADE 3.0S W/LIGHT (MISCELLANEOUS) ×1 IMPLANT
SHEATH 7FR PRELUDE SNAP 13 (SHEATH) IMPLANT
SHEATH 9FR PRELUDE SNAP 13 (SHEATH) IMPLANT
SLING ARM IMMOBILIZER MED (SOFTGOODS) IMPLANT
SUT SILK 0 FSL (SUTURE) IMPLANT
SUT VIC AB 2-0 CT2 27 (SUTURE) IMPLANT
SUT VIC AB 4-0 PS2 18 (SUTURE) IMPLANT
TRAY PACEMAKER INSERTION (PACKS) ×1 IMPLANT

## 2022-08-31 NOTE — Progress Notes (Signed)
1304 5/10 mid chst pain and tightness nitroglycerin x1 given  1309 Chest pain 4/10 and tight 124/82 HR 66 nitroglycerin x2 given  1318 BP 97/70 HR 71 nitroglycerin x3 given   Another EKG done and Provider notified.  3rd EKG done, emesis bag given with emesis x1.  Patient still report mid chest pain 4/10 and tightness. 4L O2 placed via nasal cannula.

## 2022-08-31 NOTE — Plan of Care (Signed)
?  Problem: Health Behavior/Discharge Planning: ?Goal: Ability to manage health-related needs will improve ?Outcome: Progressing ?  ?Problem: Clinical Measurements: ?Goal: Will remain free from infection ?Outcome: Progressing ?  ?Problem: Clinical Measurements: ?Goal: Respiratory complications will improve ?Outcome: Progressing ?  ?Problem: Clinical Measurements: ?Goal: Cardiovascular complication will be avoided ?Outcome: Progressing ?  ?Problem: Pain Managment: ?Goal: General experience of comfort will improve ?Outcome: Progressing ?  ?Problem: Safety: ?Goal: Ability to remain free from injury will improve ?Outcome: Progressing ?  ?

## 2022-08-31 NOTE — Discharge Instructions (Addendum)
Please restart your eliquis (apixaban) on Thursday, 6/20.   For 6 weeks, avoid lifting greater than 15 pounds or raising your left arm above your head. Please do not shower until Thursday, 6/20 and do not submerge your left chest in water (no baths, swimming) for at least 1 week or until you follow up with Dr. Darrold Junker. You can remove the clear bandage on your left chest if it starts to peel off, but please do NOT take off the steri strips underneath (thin rectangular strips). Take all your medicines as prescribed, including the antibiotic I have prescribed called Keflex (cefalexin). Please call Dr. Darrold Junker' office at [facility] [phone]) or if you have any questions or concerns.

## 2022-08-31 NOTE — Progress Notes (Signed)
Patient went to xray for cxr. Per Dr. Darrold Junker, he will stay until lunch time until a decision is made to admit or D/C. Family at bedside and aware of the plan.

## 2022-08-31 NOTE — Progress Notes (Signed)
Patient had an episode of nausea with dry heaving. At the time of the nausea, he complained of really bad right arm pain. Zofran was given, provider contacted, EKG done.   Patient is now feeling better.

## 2022-08-31 NOTE — Progress Notes (Cosign Needed Addendum)
[name] is an 18yoM with sick sinus syndrome, CAD (hx anterior STEMI 2001 s/p LAD stent, patent by [facility] 2022, 50% ostial RCA IFR 0.95, medically managed) who presented to [facility] 6/18 for elective dual chamber pacemaker implant with Dr. Darrold Junker. The procedure went well without complications. Device interrogated post-procedurally and was functioning well. CXR was without pneumothorax with appropriate placement of RA and RV leads. At 1300 was notified by specials nursing staff that patient experienced central chest tightness at a 5/10 with radiation to his R arm and associated with nausea + vomiting. Patient was given SL nitroglycerin x 3 doses with very slight improvement in chest pain to a 4/10.   At my time of evaluation this afternoon, the patient is sitting up in bed with his wife and granddaughter present at bedside. Appears comfortable and hemodynamically stable. BP WNL at 124/77. Continues to voice 5/10 chest "squeezing" worsened with respirations, associated with a sensation of inability to take a deep breath. On exam rhythm and rate are regular with S1 & S2 present. Breath sounds present bilaterally without increased work of breathing. Chest is nontender to palpation along the sternum, gauze and tegaderm covering pacemaker pocket incision with trace ecchymosis underneath bandage without active bleeding or oozing. L arm in sling. No tenderness to palpation of the epigastrum or abdomen. EKGs demonstrate initially an AV paced rhythm with rate 60, repeat showing NSR with rate of 64bpm without acute ST changes.   Plan:   # sick sinus syndrome  # s/p dual chamber PPM  # chest pain  - discussed with Dr. Darrold Junker who recommends checking HS troponin x 2 - give home imdur 60mg  daily - repeat CXR - give IV toradol x 1 and monitor response - consider protonix   ADDENDUM 1536:  Following toradol, chest tightness was much improved and he requested something to eat and drink. Initial troponin  123, compared to 03/27/2022 during ER visit for AF RVR elevated at 252 and 380. Suspect chronic elevation with known underlying CAD and not ACS as pain is improving with NSAID + home medications.  - repeat EKG - defer heparin infusion - ok to go from specials recovery to 2A for overnight monitoring - check troponin #2 at [address] PA-C

## 2022-08-31 NOTE — Discharge Summary (Signed)
Discharge Summary      Patient ID: JAHARRI MLYNARCZYK MRN: [id number] DOB/AGE: 08-28-1935 87 y.o.  Admit date: 08/31/2022 Discharge date: 09/01/2022  Primary Discharge Diagnosis sick sinus syndrome Secondary Discharge Diagnosis s/p dual chamber pacemaker  Significant Diagnostic Studies: Medtronic Dual chamber PPM implant   The left chest was prepped and draped in usual sterile manner. Anesthesia was obtained 1% lidocaine locally. A 6 cm incision was performed over the left pectoral region. The pacemaker pocket was generated by electrocautery and blunt dissection. Access was obtained to the left subclavian vein. MRI compatible Medtronic leads were positioned in the right ventricular apical septum and right atrial appendage under fluoroscopic guidance. After proper thresholds were obtained the leads were sutured in place with 0 silk. The leads were connected to an MRI compatible chronic dual-chamber pacemaker generator. The pacemaker pocket was irrigated with gentamicin solution. The pacemaker generator was positioned into the pocket and the pocket was closed with 2-0 and 4-0 Vicryl, respectively. Steri-Strips and Tegaderm dressing were applied. Patient received 1 mg of Versed and 25 mcg of fentanyl. There were no periprocedural complications. Postprocedural interrogation revealed appropriate atrial and ventricular sensing and pacing thresholds. Estimated blood loss <50 mL.   Consults: none  Hospital Course: The patient was brought to the cardiac cath lab and underwent Medtronic dual-chamber pacemaker implantation with Dr. Marcina Millard on 08/31/2022. The patient tolerated with procedure well without complications. The device was interrogated post procedure and is functioning appropriately with good threshold and lead impedance.  Initial postprocedural chest x-ray was without pneumothorax. Hospital course was complicated by some squeezing / pleuritic chest discomfort several hours  postprocedurally associated with a sensation of inability to take a deep breath.  Repeat chest x-ray was without pneumothorax, multiple serial EKGs showed an AV paced rhythm without ST or T wave changes suggesting ischemia.  High-sensitivity troponin x 2 over the course of 2 hours was elevated but flat trending at 123, 124 respectively suggestive of chronic elevation with his known underlying coronary disease history of stent placement and not ACS.  The patient's chest pain was eventually was relieved with IV Toradol and spontaneously resolved later the afternoon of 6/18, he ate dinner and had an otherwise uneventful evening following this.  On 6/19 he was examined with his wife and daughter present and was without recurrent chest discomfort or dyspnea and was eager to go home. The patient was given care instructions for the pulse generator incision and ER precautions and will follow up in clinic in 1 week, or sooner if needed.     Discharge Exam: Blood pressure 121/70, pulse 68, temperature 98.2 F (36.8 C), resp. rate 16, height 5\' 8"  (1.727 m), weight 63.5 kg, SpO2 97 %.   General: pleasant elderly male, well nourished, in no acute distress.  Sitting upright in bed with wife and daughter present. HEENT:  Normocephalic and atraumatic. Neck:   No JVD.  Chest: left chest with clean dry gauze and tegaderm covering linear pacemaker pocket incision, without significant surrounding ecchymosis or active bleeding.  Trace ecchymosis inferolaterally to the incision site without oozing or bleeding on the Tegaderm. Lungs: Normal respiratory effort on room air.  Clear to ascultation bilaterally. Heart: HRRR . Normal S1 and S2 without gallops or murmurs.  Abdomen: non-distended appearing.  Msk: Normal strength and tone for age. Extremities: No peripheral edema.  Neuro: Alert and oriented x3 Psych:  calm and cooperative.   Labs:   Lab Results  Component Value Date   WBC  5.5 03/27/2022   HGB 14.7  03/27/2022   HCT 44.8 03/27/2022   MCV 97.0 03/27/2022   PLT 163 03/27/2022      Radiology:  CLINICAL DATA:  Placement of cardiac pacer   EXAM: CHEST - 2 VIEW   COMPARISON:  03/27/2022   FINDINGS: Cardiac size is within normal limits. Lung fields are clear of any infiltrates or pulmonary edema. There is no pleural effusion or pneumothorax. There is interval placement of pacemaker battery in the left infraclavicular region. Tips of leads are seen in right atrium and right ventricle.   IMPRESSION: Interval placement of cardiac pacemaker battery in left infraclavicular region. There is no pneumothorax. There are no focal infiltrates or signs of pulmonary edema.     Electronically Signed   By: Ernie Avena M.D.   On: 08/31/2022 11:19  EKG: AV paced rhythm rate 60  FOLLOW UP PLANS AND APPOINTMENTS  Allergies as of 09/01/2022   No Known Allergies      Medication List     STOP taking these medications    nitroGLYCERIN 0.4 MG SL tablet Commonly known as: NITROSTAT       TAKE these medications    Apixaban Starter Pack (10mg  and 5mg ) Commonly known as: ELIQUIS STARTER PACK Take as directed on package: start with two-5mg  tablets twice daily for 7 days. On day 8, switch to one-5mg  tablet twice daily.   calcium carbonate 750 MG chewable tablet Commonly known as: TUMS EX Chew 1 tablet by mouth daily as needed for heartburn.   cephALEXin 500 MG capsule Commonly known as: Keflex Take 1 capsule (500 mg total) by mouth 2 (two) times daily for 10 days.   hydrochlorothiazide 12.5 MG capsule Commonly known as: MICROZIDE Take 12.5 mg by mouth daily.   isosorbide mononitrate 60 MG 24 hr tablet Commonly known as: IMDUR Take 60 mg by mouth daily.   meclizine 25 MG tablet Commonly known as: ANTIVERT Take 1 tablet by mouth 3 (three) times daily as needed.   metoprolol succinate 50 MG 24 hr tablet Commonly known as: Toprol XL Take 1 tablet (50 mg total) by  mouth daily. Take with or immediately following a meal.   PreserVision AREDS 2 Caps Take 1 capsule by mouth daily.   ramipril 10 MG capsule Commonly known as: ALTACE Take 10 mg by mouth 2 (two) times daily.   simvastatin 10 MG tablet Commonly known as: ZOCOR Take 5 mg by mouth at bedtime.        Follow-up Information     Paraschos, Alexander, MD. Go in 1 week(s).   Specialty: Cardiology Contact information: [address] [facility] [place] [address] [phone]                 PLEASE BRING ALL MEDICATIONS WITH YOU TO FOLLOW UP APPOINTMENTS  This patient's plan of care was discussed and created with Dr. Darrold Junker and he is in agreement.    Time spent with patient: >30 mins Signed:  Cheryln Manly Arrington Yohe PA-C 09/01/2022, 10:01 AM [facility]

## 2022-09-01 ENCOUNTER — Encounter: Payer: Self-pay | Admitting: Cardiology

## 2022-09-01 DIAGNOSIS — I495 Sick sinus syndrome: Secondary | ICD-10-CM | POA: Diagnosis not present

## 2022-09-01 MED ORDER — CEPHALEXIN 500 MG PO CAPS: 500.0000 mg | ORAL_CAPSULE | Freq: Two times a day (BID) | ORAL | 0 refills | Status: AC

## 2022-09-01 NOTE — Progress Notes (Signed)
Transition of Care [facility]) - Inpatient Brief Assessment   Patient Details  Name: Hunter Carey MRN: [id number] Date of Birth: [birth year]  Transition of Care [facility]) CM/SW Contact:    Truddie Hidden, RN Phone Number: 09/01/2022, 12:59 PM   Clinical Narrative: TOC assessing for ongoing needs and discharge planning.   Transition of Care Asessment: Insurance and Status: Insurance coverage has been reviewed Patient has primary care physician: Yes Home environment has been reviewed: Return to home   Prior/Current Home Services: No current home services Social Determinants of Health Reivew: SDOH reviewed no interventions necessary Readmission risk has been reviewed: Yes Transition of care needs: no transition of care needs at this time

## 2022-11-03 ENCOUNTER — Emergency Department: Payer: Medicare Other

## 2022-11-03 ENCOUNTER — Other Ambulatory Visit: Payer: Self-pay

## 2022-11-03 ENCOUNTER — Inpatient Hospital Stay
Admission: EM | Admit: 2022-11-03 | Discharge: 2022-11-05 | DRG: 322 | Disposition: A | Discharge status: Home / Self Care | Payer: Medicare Other | Source: Ambulatory Visit | Attending: Osteopathic Medicine | Admitting: Osteopathic Medicine

## 2022-11-03 DIAGNOSIS — R7989 Other specified abnormal findings of blood chemistry: Secondary | ICD-10-CM | POA: Diagnosis not present

## 2022-11-03 DIAGNOSIS — I480 Paroxysmal atrial fibrillation: Secondary | ICD-10-CM | POA: Insufficient documentation

## 2022-11-03 DIAGNOSIS — Z950 Presence of cardiac pacemaker: Secondary | ICD-10-CM

## 2022-11-03 DIAGNOSIS — Z8249 Family history of ischemic heart disease and other diseases of the circulatory system: Secondary | ICD-10-CM

## 2022-11-03 DIAGNOSIS — Z9889 Other specified postprocedural states: Secondary | ICD-10-CM | POA: Diagnosis not present

## 2022-11-03 DIAGNOSIS — Z9841 Cataract extraction status, right eye: Secondary | ICD-10-CM | POA: Diagnosis not present

## 2022-11-03 DIAGNOSIS — E785 Hyperlipidemia, unspecified: Secondary | ICD-10-CM | POA: Diagnosis present

## 2022-11-03 DIAGNOSIS — I2510 Atherosclerotic heart disease of native coronary artery without angina pectoris: Secondary | ICD-10-CM

## 2022-11-03 DIAGNOSIS — Z7901 Long term (current) use of anticoagulants: Secondary | ICD-10-CM

## 2022-11-03 DIAGNOSIS — I10 Essential (primary) hypertension: Secondary | ICD-10-CM | POA: Diagnosis present

## 2022-11-03 DIAGNOSIS — I214 Non-ST elevation (NSTEMI) myocardial infarction: Secondary | ICD-10-CM | POA: Diagnosis present

## 2022-11-03 DIAGNOSIS — I4892 Unspecified atrial flutter: Secondary | ICD-10-CM | POA: Diagnosis present

## 2022-11-03 DIAGNOSIS — Z87891 Personal history of nicotine dependence: Secondary | ICD-10-CM

## 2022-11-03 DIAGNOSIS — Z955 Presence of coronary angioplasty implant and graft: Secondary | ICD-10-CM

## 2022-11-03 DIAGNOSIS — Z961 Presence of intraocular lens: Secondary | ICD-10-CM | POA: Diagnosis present

## 2022-11-03 DIAGNOSIS — Z7982 Long term (current) use of aspirin: Secondary | ICD-10-CM | POA: Diagnosis not present

## 2022-11-03 DIAGNOSIS — R079 Chest pain, unspecified: Secondary | ICD-10-CM | POA: Diagnosis present

## 2022-11-03 DIAGNOSIS — Z79899 Other long term (current) drug therapy: Secondary | ICD-10-CM | POA: Diagnosis not present

## 2022-11-03 DIAGNOSIS — Z8673 Personal history of transient ischemic attack (TIA), and cerebral infarction without residual deficits: Secondary | ICD-10-CM | POA: Diagnosis not present

## 2022-11-03 DIAGNOSIS — I252 Old myocardial infarction: Secondary | ICD-10-CM

## 2022-11-03 DIAGNOSIS — Z7902 Long term (current) use of antithrombotics/antiplatelets: Secondary | ICD-10-CM

## 2022-11-03 DIAGNOSIS — Z9842 Cataract extraction status, left eye: Secondary | ICD-10-CM

## 2022-11-03 DIAGNOSIS — J449 Chronic obstructive pulmonary disease, unspecified: Secondary | ICD-10-CM | POA: Diagnosis present

## 2022-11-03 DIAGNOSIS — E876 Hypokalemia: Secondary | ICD-10-CM | POA: Diagnosis present

## 2022-11-03 DIAGNOSIS — R011 Cardiac murmur, unspecified: Secondary | ICD-10-CM | POA: Diagnosis present

## 2022-11-03 DIAGNOSIS — I495 Sick sinus syndrome: Secondary | ICD-10-CM | POA: Diagnosis present

## 2022-11-03 LAB — BASIC METABOLIC PANEL
Anion gap: 8 (ref 5–15)
BUN: 20 mg/dL (ref 8–23)
CO2: 30 mmol/L (ref 22–32)
Calcium: 9.9 mg/dL (ref 8.9–10.3)
Chloride: 99 mmol/L (ref 98–111)
Creatinine, Ser: 0.92 mg/dL (ref 0.61–1.24)
GFR, Estimated: >60 mL/min (ref >60)
Glucose, Bld: 94 mg/dL (ref 70–99)
Potassium: 3.2 mmol/L — ABNORMAL LOW (ref 3.5–5.1)
Sodium: 137 mmol/L (ref 135–145)

## 2022-11-03 LAB — PROTIME-INR
INR: 1.2 (ref 0.8–1.2)
Prothrombin Time: 15.5 seconds — ABNORMAL HIGH (ref 11.4–15.2)

## 2022-11-03 LAB — CBC
HCT: 50.4 % (ref 39.0–52.0)
Hemoglobin: 16.6 g/dL (ref 13.0–17.0)
MCH: 31.6 pg (ref 26.0–34.0)
MCHC: 32.9 g/dL (ref 30.0–36.0)
MCV: 95.8 fL (ref 80.0–100.0)
Platelets: 181 10*3/uL (ref 150–400)
RBC: 5.26 MIL/uL (ref 4.22–5.81)
RDW: 11.9 % (ref 11.5–15.5)
WBC: 5.6 10*3/uL (ref 4.0–10.5)
nRBC: 0 % (ref 0.0–0.2)

## 2022-11-03 LAB — HEPARIN LEVEL (UNFRACTIONATED): Heparin Unfractionated: 1.04 [IU]/mL — ABNORMAL HIGH (ref 0.30–0.70)

## 2022-11-03 LAB — TROPONIN I (HIGH SENSITIVITY)
Troponin I (High Sensitivity): 5072 ng/L (ref <18)
Troponin I (High Sensitivity): 4809 ng/L (ref <18)

## 2022-11-03 LAB — APTT: aPTT: 38 seconds — ABNORMAL HIGH (ref 24–36)

## 2022-11-03 MED ORDER — DILTIAZEM HCL 25 MG/5ML IV SOLN
10.0000 mg | Freq: Once | INTRAVENOUS | Status: AC
  Administered 2022-11-03: 10 mg via INTRAVENOUS
  Filled 2022-11-03: qty 5

## 2022-11-03 MED ORDER — HEPARIN (PORCINE) 25000 UT/250ML-% IV SOLN
950.0000 [IU]/h | INTRAVENOUS | Status: DC
  Administered 2022-11-03: 750 [IU]/h via INTRAVENOUS
  Filled 2022-11-03: qty 250

## 2022-11-03 MED ORDER — DILTIAZEM HCL-DEXTROSE 125-5 MG/125ML-% IV SOLN (PREMIX)
5.0000 mg/h | INTRAVENOUS | Status: DC
  Administered 2022-11-03: 5 mg/h via INTRAVENOUS
  Administered 2022-11-04: 12.5 mg/h via INTRAVENOUS
  Filled 2022-11-03 (×2): qty 125

## 2022-11-03 MED ORDER — ACETAMINOPHEN 325 MG PO TABS: 650.0000 mg | ORAL_TABLET | Freq: Four times a day (QID) | ORAL | Status: DC | PRN

## 2022-11-03 MED ORDER — ASPIRIN 81 MG PO CHEW
324.0000 mg | CHEWABLE_TABLET | Freq: Once | ORAL | Status: AC
  Administered 2022-11-03: 324 mg via ORAL
  Filled 2022-11-03: qty 4

## 2022-11-03 MED ORDER — SENNOSIDES-DOCUSATE SODIUM 8.6-50 MG PO TABS: 1.0000 | ORAL_TABLET | Freq: Every evening | ORAL | Status: DC | PRN

## 2022-11-03 MED ORDER — ONDANSETRON HCL 4 MG/2ML IJ SOLN: 4.0000 mg | Freq: Four times a day (QID) | INTRAMUSCULAR | Status: DC | PRN

## 2022-11-03 MED ORDER — METOPROLOL TARTRATE 5 MG/5ML IV SOLN: 5.0000 mg | INTRAVENOUS | Status: AC | PRN

## 2022-11-03 MED ORDER — ACETAMINOPHEN 325 MG RE SUPP: 650.0000 mg | Freq: Four times a day (QID) | RECTAL | Status: DC | PRN

## 2022-11-03 MED ORDER — MORPHINE SULFATE (PF) 2 MG/ML IV SOLN: 2.0000 mg | INTRAVENOUS | Status: AC | PRN

## 2022-11-03 MED ORDER — SIMVASTATIN 10 MG PO TABS
5.0000 mg | ORAL_TABLET | Freq: Every day | ORAL | Status: DC
  Administered 2022-11-03: 5 mg via ORAL
  Filled 2022-11-03 (×3): qty 1

## 2022-11-03 MED ORDER — ONDANSETRON HCL 4 MG PO TABS: 4.0000 mg | ORAL_TABLET | Freq: Four times a day (QID) | ORAL | Status: DC | PRN

## 2022-11-03 NOTE — Assessment & Plan Note (Addendum)
Continue diltiazem GGT, goal heart rate less than 120 Metoprolol tartrate 5 mg IV every 3 hours as needed for heart rate greater than 125, 12 hours ordered Nursing order placed to interrogate Medtronic pacemaker Home Eliquis not resumed on admission Continue to PCU, inpatient

## 2022-11-03 NOTE — Assessment & Plan Note (Addendum)
Continue heparin per pharmacy Schedule repeat troponin level, decreased to 4809 Complete echo ordered Cardiology has been consulted via secure chat and epic order, Dr. Melton Alar is aware

## 2022-11-03 NOTE — ED Notes (Signed)
Blue top sent to lab. 

## 2022-11-03 NOTE — ED Notes (Signed)
Provided skid proof socks for pt to stand independently to use a urinal

## 2022-11-03 NOTE — Progress Notes (Signed)
ANTICOAGULATION CONSULT NOTE - Initial Consult  Pharmacy Consult for heparin Indication: chest pain/ACS  No Known Allergies  Patient Measurements: Height: 5\' 8"  (172.7 cm) Weight: 61.6 kg (135 lb 11.2 oz) IBW/kg (Calculated) : 68.4 Heparin Dosing Weight: 61.6 kg  Vital Signs: Temp: 98 F (36.7 C) (08/21 1543) Temp Source: Oral (08/21 1543) BP: 109/85 (08/21 1630) Pulse Rate: 151 (08/21 1633)  Labs: Recent Labs    11/03/22 1546  HGB 16.6  HCT 50.4  PLT 181  CREATININE 0.92  TROPONINIHS 5,072*   CrCl cannot be calculated (Unknown ideal weight.).  Medical History: Past Medical History:  Diagnosis Date   CAD (coronary artery disease)    Compression fracture h/o   COPD (chronic obstructive pulmonary disease) (HCC)    Edema    VERY MILD OF CALF OCCAS   Heart murmur    Hyperlipemia    Hypertension    MI, old 2001   lad stent   Shortness of breath dyspnea    DOE   Assessment: 87 y/o male presenting with tachycardia and chest pain. PMH significant for atrial fibrillation (on Eliquis PTA), history of STEMI (2001) with stent to LAD, history of TIA (2013). Pharmacy has been consulted to initiate heparin infusion.  Per patient, last dose of apixaban taken on 11/03/22 at approximately 0800. Given recent use of DOAC, will start heparin infusion without bolus and will monitor with Anti-Xa and aPTT levels until correlating. Hgb 16.6, plt WNL. No signs/symptoms of bleeding noted.   Goal of Therapy:  Heparin level 0.3-0.7 units/ml aPTT 66-102 seconds Monitor platelets by anticoagulation protocol: Yes   Plan: Start heparin infusion at 750 units/hour at 2000 on 11/03/22 (~12 hours after last reported dose of Eliquis) Check 8-hour aPTT and Anti-Xa levels Monitor daily aPTT and Anti-Xa levels until correlating Monitor CBC and signs/symptoms of bleeding  Thank you for involving pharmacy in this patient's care.   Rockwell Alexandria, PharmD Clinical Pharmacist 11/03/2022 5:00  PM

## 2022-11-03 NOTE — Assessment & Plan Note (Signed)
Status post anterior STEMI in stent placement to the LAD in January 2001

## 2022-11-03 NOTE — ED Triage Notes (Signed)
Pt to ED via POV from [facility]. Pt reports his Hr has been running high in the 150s all week. Pt was seen at [facility] and sent over for possible afib RVR. Pt reports CP yesterday but denies it currently. Pt is on Eliquis

## 2022-11-03 NOTE — Assessment & Plan Note (Signed)
Status post Medtronic dual-chamber pacemaker placement on 08/31/2022

## 2022-11-03 NOTE — H&P (Addendum)
History and Physical   Hunter Carey BMW:[id number] DOB: 10-29-1935 DOA: 11/03/2022  PCP: Mick Sell, MD  Outpatient Specialists: Dr. Gillermo Murdoch clinic cardiology Patient coming from: [place] [facility] via POV  I have personally briefly reviewed patient's old medical records in [facility] EMR.  Chief Concern: Chest pain, heart palpitation  HPI: [name] is an 87 year old male with history of sick sinus syndrome status post pacemaker placement, hypertension, hyperlipidemia, anticoagulation use with Eliquis, history of CAD status post STEMI requiring stent placement to the LAD, history of sinus bradycardia, history of TIA, who presents to the ED for chief concerns of heart palpitation.  Vitals in the ED showed temperature of 98, respiration rate of 18, heart rate 138, blood pressure 139/98, SpO2 96% on room air.  Serum sodium is 137, potassium 3.2, chloride 99, bicarb 30, BUN of 20, serum creatinine of 0.92, EGFR greater than 60, nonfasting blood glucose 94, WBC 5.6, hemoglobin 16.6, platelets of 181.  High-sensitivity troponin was elevated to 5072.  ED treatment: Diltiazem 10 mg IV one-time dose with intermittent improvement and then patient went back to atrial flutter prompting EDP to start patient on diltiazem drip.  Heparin per pharmacy has been ordered. --------------------------- At bedside, he was able to tell me his name, age, location, current calendar year.  He reports about 3 days ago he started to experience heart palpitation, heart rate greater than 150.  He endorses compliance with home medication.  He reports on 8/20, while sitting and watching TV he experienced a chest squeezing sensation in the middle of his chest.  This sensation lasted approximately 1 hour and went away on its own.  He denies shortness of breath, extremity numbness or pain, dysuria, hematuria, diarrhea, syncope, loss of consciousness.  Social history: He lives at home with his  wife.  He denies tobacco, EtOH, recreational drug use.  He is a former tobacco use and quit many years ago.  ROS: Constitutional: no weight change, no fever ENT/Mouth: no sore throat, no rhinorrhea Eyes: no eye pain, no vision changes Cardiovascular: + chest pain, no dyspnea,  no edema, + palpitations Respiratory: no cough, no sputum, no wheezing Gastrointestinal: no nausea, no vomiting, no diarrhea, no constipation Genitourinary: no urinary incontinence, no dysuria, no hematuria Musculoskeletal: no arthralgias, no myalgias Skin: no skin lesions, no pruritus, Neuro: + weakness, no loss of consciousness, no syncope Psych: no anxiety, no depression, no decrease appetite Heme/Lymph: no bruising, no bleeding  ED Course: Discussed with emergency medicine provider, patient requiring hospitalization for chief concerns of NSTEMI.  Assessment/Plan  Principal Problem:   NSTEMI (non-ST elevated myocardial infarction) [facility]) Active Problems:   Chest pain   Sick sinus syndrome (HCC)   Paroxysmal A-fib (HCC)   CAD S/P percutaneous coronary angioplasty   Assessment and Plan:  * NSTEMI (non-ST elevated myocardial infarction) (HCC) Continue heparin per pharmacy Schedule repeat troponin level, decreased to 4809 Complete echo ordered Cardiology has been consulted via secure chat and epic order, Dr. Melton Alar is aware  CAD S/P percutaneous coronary angioplasty Status post anterior STEMI in stent placement to the LAD in January 2001  Paroxysmal A-fib (HCC) Continue diltiazem GGT, goal heart rate less than 120 Metoprolol tartrate 5 mg IV every 3 hours as needed for heart rate greater than 125, 12 hours ordered Nursing order placed to interrogate Medtronic pacemaker Home Eliquis not resumed on admission Continue to PCU, inpatient  Sick sinus syndrome [facility]) Status post Medtronic dual-chamber pacemaker placement on 08/31/2022  Chest pain  Presumed cardiac related, ACS Treat per  above Symptomatic support: Morphine 2 mg IV every 4 hours as needed for moderate, severe, chest pain Patient states that his outpatient cardiologist states he cannot have any nitroglycerin  Chart reviewed.   DVT prophylaxis: Heparin gtt per pharmacy Code Status: Full code Diet: Heart healthy; n.p.o. after midnight Family Communication: A phone call was offered, patient declined stating that his wife is not home yet Disposition Plan: Pending clinical course Consults called: Cardiology Admission status: PCU, inpatient  Past Medical History:  Diagnosis Date   CAD (coronary artery disease)    Compression fracture h/o   COPD (chronic obstructive pulmonary disease) (HCC)    Edema    VERY MILD OF CALF OCCAS   Heart murmur    Hyperlipemia    Hypertension    MI, old 2001   lad stent   Shortness of breath dyspnea    DOE   Past Surgical History:  Procedure Laterality Date   CATARACT EXTRACTION W/PHACO Left 03/24/2015   Procedure: CATARACT EXTRACTION PHACO AND INTRAOCULAR LENS PLACEMENT (IOC);  Surgeon: Sallee Lange, MD;  Location: [facility] ORS;  Service: Ophthalmology;  Laterality: Left;  [place] 01:06 AP% 21.7 CDE 29.12 fluid pack lot # [id number]   CATARACT EXTRACTION W/PHACO Right 02/21/2018   Procedure: CATARACT EXTRACTION PHACO AND INTRAOCULAR LENS PLACEMENT (IOC) RIGHT;  Surgeon: Nevada Crane, MD;  Location: [facility];  Service: Ophthalmology;  Laterality: Right;   COLONOSCOPY WITH PROPOFOL N/A 08/25/2015   Procedure: COLONOSCOPY WITH PROPOFOL;  Surgeon: Scot Jun, MD;  Location: [facility] ENDOSCOPY;  Service: Endoscopy;  Laterality: N/A;   CORONARY ANGIOPLASTY WITH STENT PLACEMENT     CORONARY PRESSURE/FFR STUDY N/A 10/30/2020   Procedure: INTRAVASCULAR PRESSURE WIRE/FFR STUDY;  Surgeon: Marcina Millard, MD;  Location: [facility] INVASIVE CV LAB;  Service: Cardiovascular;  Laterality: N/A;  RCA   HERNIA REPAIR     LEFT HEART CATH AND CORONARY ANGIOGRAPHY Left 08/15/2018    Procedure: LEFT HEART CATH AND CORONARY ANGIOGRAPHY;  Surgeon: Marcina Millard, MD;  Location: [facility] INVASIVE CV LAB;  Service: Cardiovascular;  Laterality: Left;   LEFT HEART CATH AND CORONARY ANGIOGRAPHY N/A 10/30/2020   Procedure: LEFT HEART CATH AND CORONARY ANGIOGRAPHY;  Surgeon: Marcina Millard, MD;  Location: [facility] INVASIVE CV LAB;  Service: Cardiovascular;  Laterality: N/A;   PACEMAKER IMPLANT N/A 08/31/2022   Procedure: PACEMAKER IMPLANT;  Surgeon: Marcina Millard, MD;  Location: [facility] INVASIVE CV LAB;  Service: Cardiovascular;  Laterality: N/A;   VASECTOMY     Social History:  reports that he has quit smoking. He has never used smokeless tobacco. He reports current alcohol use of about 7.0 standard drinks of alcohol per week. He reports that he does not use drugs.  No Known Allergies Family History  Problem Relation Age of Onset   Hypertension Mother    Family history: Family history reviewed and not pertinent.  Prior to Admission medications   Medication Sig Start Date End Date Taking? Authorizing Provider  APIXABAN (ELIQUIS) VTE STARTER PACK (10MG  AND 5MG ) Take as directed on package: start with two-5mg  tablets twice daily for 7 days. On day 8, switch to one-5mg  tablet twice daily. 03/27/22   Minna Antis, MD  calcium carbonate (TUMS EX) 750 MG chewable tablet Chew 1 tablet by mouth daily as needed for heartburn.    [provider]  hydrochlorothiazide (MICROZIDE) 12.5 MG capsule Take 12.5 mg by mouth daily.    [provider]  isosorbide mononitrate (IMDUR) 60 MG 24  hr tablet Take 60 mg by mouth daily. 04/24/21 08/31/22  [provider]  meclizine (ANTIVERT) 25 MG tablet Take 1 tablet by mouth 3 (three) times daily as needed. 01/20/22   [provider]  metoprolol succinate (TOPROL XL) 50 MG 24 hr tablet Take 1 tablet (50 mg total) by mouth daily. Take with or immediately following a meal. 03/27/22 03/27/23  Minna Antis, MD   Multiple Vitamins-Minerals (PRESERVISION AREDS 2) CAPS Take 1 capsule by mouth daily.    [provider]  ramipril (ALTACE) 10 MG capsule Take 10 mg by mouth 2 (two) times daily.  11/22/14   [provider]  simvastatin (ZOCOR) 10 MG tablet Take 5 mg by mouth at bedtime.  11/22/14   [provider]   Physical Exam: Vitals:   11/03/22 1730 11/03/22 1800 11/03/22 1815 11/03/22 1830  BP: (!) 116/93 (!) 115/92  124/87  Pulse:   (!) 115   Resp: (!) 29 (!) 25 (!) 32 (!) 32  Temp:      TempSrc:      SpO2:      Weight:      Height:       Constitutional: appears age-appropriate, thin, frail, cachectic appearing Eyes: PERRL, lids and conjunctivae normal ENMT: Mucous membranes are moist. Posterior pharynx clear of any exudate or lesions. Age-appropriate dentition. Hearing appropriate Neck: normal, supple, no masses, no thyromegaly Respiratory: clear to auscultation bilaterally, no wheezing, no crackles. Normal respiratory effort. No accessory muscle use.  Cardiovascular: Elevated heart rate, no murmurs / rubs / gallops. No extremity edema. 2+ pedal pulses. No carotid bruits.  Abdomen: no tenderness, no masses palpated, no hepatosplenomegaly. Bowel sounds positive.  Musculoskeletal: no clubbing / cyanosis. No joint deformity upper and lower extremities. Good ROM, no contractures, no atrophy. Normal muscle tone.  Skin: no rashes, lesions, ulcers. No induration Neurologic: Sensation intact. Strength 5/5 in all 4.  Psychiatric: Normal judgment and insight. Alert and oriented x 3. Normal mood.   EKG: independently reviewed, showing atrial flutter with rate of 151, QTc 494, right bundle branch block  Chest x-ray on Admission: I personally reviewed and I agree with radiologist reading as below.  DG Chest 2 View  Result Date: 11/03/2022 CLINICAL DATA:  CP.  Tachycardia. EXAM: CHEST - 2 VIEW COMPARISON:  08/31/2022. FINDINGS: Bilateral lungs appear hyperexpanded and hyperlucent  with coarse bronchovascular markings, concerning for COPD. Bilateral lungs otherwise appear clear. No dense consolidation. Bilateral costophrenic angles are clear. Normal cardio-mediastinal silhouette. There is a left sided 2-lead pacemaker. No acute osseous abnormalities. The soft tissues are within normal limits. IMPRESSION: 1. No active cardiopulmonary disease. 2. Probable COPD. Electronically Signed   By: Jules Schick M.D.   On: 11/03/2022 17:02    Labs on Admission: I have personally reviewed following labs CBC: Recent Labs  Lab 11/03/22 1546  WBC 5.6  HGB 16.6  HCT 50.4  MCV 95.8  PLT 181   Basic Metabolic Panel: Recent Labs  Lab 11/03/22 1546  NA 137  K 3.2*  CL 99  CO2 30  GLUCOSE 94  BUN 20  CREATININE 0.92  CALCIUM 9.9   GFR: Estimated Creatinine Clearance: 52.1 mL/min (by C-G formula based on SCr of 0.92 mg/dL).  Urine analysis:    Component Value Date/Time   COLORURINE YELLOW (A) 02/23/2016 1100   APPEARANCEUR CLEAR (A) 02/23/2016 1100   LABSPEC 1.024 02/23/2016 1100   PHURINE 5.0 02/23/2016 1100   GLUCOSEU NEGATIVE 02/23/2016 1100   HGBUR SMALL (A)  02/23/2016 1100   BILIRUBINUR NEGATIVE 02/23/2016 1100   KETONESUR NEGATIVE 02/23/2016 1100   PROTEINUR NEGATIVE 02/23/2016 1100   NITRITE NEGATIVE 02/23/2016 1100   LEUKOCYTESUR NEGATIVE 02/23/2016 1100   This document was prepared using Dragon Voice Recognition software and may include unintentional dictation errors.  Dr. Sedalia Muta [facility]  If 7PM-7AM, please contact overnight-coverage provider If 7AM-7PM, please contact day attending provider [email or web address]  11/03/2022, 6:53 PM

## 2022-11-03 NOTE — Assessment & Plan Note (Signed)
Presumed cardiac related, ACS Treat per above Symptomatic support: Morphine 2 mg IV every 4 hours as needed for moderate, severe, chest pain Patient states that his outpatient cardiologist states he cannot have any nitroglycerin

## 2022-11-03 NOTE — Hospital Course (Addendum)
[name] is an 87 year old male with history of sick sinus syndrome status post pacemaker placement, hypertension, hyperlipidemia, anticoagulation use with Eliquis, history of CAD status post STEMI requiring stent placement to the LAD, history of sinus bradycardia, history of TIA, who presents to the ED for chief concerns of heart palpitation. 08/21: in ED, (+)aflutter, placed on dilt drip. Troponin 5072, started heparin. Cardiology consulted.  08/22: LHC w/ stent, converted to NSR, monitor overnight  08/23:     Consultants:  Cardiology   Procedures: 11/04/22 left cardiac cath and stent placement       ASSESSMENT & PLAN:   Principal Problem:   NSTEMI (non-ST elevated myocardial infarction) [facility]) Active Problems:   Chest pain   Sick sinus syndrome (HCC)   Paroxysmal A-fib (HCC)   CAD S/P percutaneous coronary angioplasty   NSTEMI (non-ST elevated myocardial infarction) (HCC) Chest pain CAD S/P percutaneous coronary angioplasty 2001 heparin per pharmacy Trend troponin Echocardiogram  Cardiology plan for LHC today --> stent x2 to RCA Morphine 2 mg IV every 4 hours as needed for moderate, severe, chest pain. Patient states that his outpatient cardiologist states he cannot have any nitroglycerin  Paroxysmal A-fib (HCC), rapid response  Was on dilt gtt --> converted to NSR in cath procedure  Home Eliquis not resumed on admission since on heparin for NSTEMI    Sick sinus syndrome (HCC) Status post [employer] dual-chamber pacemaker placement on 08/31/2022 Interrogate pacer per cardiology    DVT prophylaxis: heparin Pertinent IV fluids/nutrition: can d/c continuous fluids if tolerating diet  Central lines / invasive devices: none  Code Status: FULL CODE ACP documentation reviewed: none on file   Current Admission Status: inpatient  TOC needs / Dispo plan: TBD Barriers to discharge / significant pending items: per cardiology anticipate home tomorrow if he does well  overnight

## 2022-11-03 NOTE — ED Provider Notes (Signed)
[facility] Provider Note    Event Date/Time   First MD Initiated Contact with Patient 11/03/22 1556     (approximate)   History   Fast heart rate   HPI  Hunter Carey is a 87 y.o. male who presents to the emergency department today because of concerns for fast heart rate.  First noticed it 3 days ago.  States his heart rates been going in the 150s.  Yesterday he did have some centralized chest pain.  Rates it 5 out of 10.  He denies any unusual exertion or activity prior to the symptoms starting.  No recent change in medication.     Physical Exam   Triage Vital Signs: ED Triage Vitals  Encounter Vitals Group     BP 11/03/22 1543 (!) 139/98     Systolic BP Percentile --      Diastolic BP Percentile --      Pulse Rate 11/03/22 1543 (!) 138     Resp 11/03/22 1543 18     Temp 11/03/22 1543 98 F (36.7 C)     Temp Source 11/03/22 1543 Oral     SpO2 11/03/22 1543 96 %     Weight --      Height --      Head Circumference --      Peak Flow --      Pain Score 11/03/22 1544 0     Pain Loc --      Pain Education --      Exclude from Growth Chart --     Most recent vital signs: Vitals:   11/03/22 1630 11/03/22 1633  BP: 109/85   Pulse: (!) 152 (!) 151  Resp: (!) 29 (!) 31  Temp:    SpO2: 96% 92%   General: Awake, alert, oriented. CV:  Good peripheral perfusion. Tachycardia. Resp:  Normal effort. Lungs clear. Abd:  No distention.    ED Results / Procedures / Treatments   Labs (all labs ordered are listed, but only abnormal results are displayed) Labs Reviewed  BASIC METABOLIC PANEL - Abnormal; Notable for the following components:      Result Value   Potassium 3.2 (*)    All other components within normal limits  TROPONIN I (HIGH SENSITIVITY) - Abnormal; Notable for the following components:   Troponin I (High Sensitivity) 5,072 (*)    All other components within normal limits  CBC  HEPARIN LEVEL (UNFRACTIONATED)  APTT  CBC   BASIC METABOLIC PANEL  TROPONIN I (HIGH SENSITIVITY)     EKG  I, Phineas Semen, attending physician, personally viewed and interpreted this EKG  EKG Time: 1545 Rate: 151 Rhythm: atrial flutter Axis: normal Intervals: qtc 494 QRS: RBBB ST changes: no st elevation Impression: abnormal ekg   RADIOLOGY I independently interpreted and visualized the CXR. My interpretation: No pneumonia Radiology interpretation:  IMPRESSION:  1. No active cardiopulmonary disease.  2. Probable COPD.     PROCEDURES:  Critical Care performed: Yes  CRITICAL CARE Performed by: Phineas Semen   Total critical care time: 30 minutes  Critical care time was exclusive of separately billable procedures and treating other patients.  Critical care was necessary to treat or prevent imminent or life-threatening deterioration.  Critical care was time spent personally by me on the following activities: development of treatment plan with patient and/or surrogate as well as nursing, discussions with consultants, evaluation of patient's response to treatment, examination of patient, obtaining history from patient or surrogate,  ordering and performing treatments and interventions, ordering and review of laboratory studies, ordering and review of radiographic studies, pulse oximetry and re-evaluation of patient's condition.   Procedures    MEDICATIONS ORDERED IN ED: Medications  diltiazem (CARDIZEM) injection 10 mg (10 mg Intravenous Given 11/03/22 1615)     IMPRESSION / MDM / ASSESSMENT AND PLAN / ED COURSE  I reviewed the triage vital signs and the nursing notes.                              Differential diagnosis includes, but is not limited to, arrythmia, anemia, electrolyte abnormality, dehydration, infection, ACS  Patient's presentation is most consistent with acute presentation with potential threat to life or bodily function.   The patient is on the cardiac monitor to evaluate for  evidence of arrhythmia and/or significant heart rate changes.  Patient presented to the emergency department today because of concerns for fast heart rate for the past few days.  Also endorsed chest pain yesterday.  On exam patient was noted to be tachycardic in the 150s.  Did initially try a bolus of diltiazem.  This did temporarily slow him down some and on the monitor it revealed an underlying atrial flutter rhythm.  He however went back into the 150s so diltiazem infusion was ordered.  Additionally blood work was checked and which showed significant troponin elevation.  I do have concern that the patient might have had a heart attack yesterday when he had the chest pain.  Will start IV heparin.  Discussed with Dr. Sedalia Muta with the hospitalist service who will evaluate for admission.      FINAL CLINICAL IMPRESSION(S) / ED DIAGNOSES   Final diagnoses:  Atrial flutter with rapid ventricular response (HCC)  Elevated troponin      Note:  This document was prepared using Dragon voice recognition software and may include unintentional dictation errors.    Phineas Semen, MD 11/03/22 [phone]

## 2022-11-04 ENCOUNTER — Inpatient Hospital Stay
Admit: 2022-11-04 | Discharge: 2022-11-04 | Disposition: A | Discharge status: Home / Self Care | Payer: Medicare Other | Attending: Internal Medicine | Admitting: Internal Medicine

## 2022-11-04 ENCOUNTER — Encounter
Admission: EM | Disposition: A | Discharge status: Home / Self Care | Payer: Self-pay | Source: Ambulatory Visit | Attending: Osteopathic Medicine

## 2022-11-04 ENCOUNTER — Other Ambulatory Visit: Payer: Self-pay

## 2022-11-04 DIAGNOSIS — R7989 Other specified abnormal findings of blood chemistry: Secondary | ICD-10-CM

## 2022-11-04 DIAGNOSIS — I214 Non-ST elevation (NSTEMI) myocardial infarction: Secondary | ICD-10-CM | POA: Diagnosis not present

## 2022-11-04 DIAGNOSIS — R079 Chest pain, unspecified: Secondary | ICD-10-CM

## 2022-11-04 HISTORY — PX: CORONARY STENT INTERVENTION: CATH118234

## 2022-11-04 HISTORY — PX: LEFT HEART CATH AND CORONARY ANGIOGRAPHY: CATH118249

## 2022-11-04 LAB — CBC
HCT: 46.3 % (ref 39.0–52.0)
Hemoglobin: 15.5 g/dL (ref 13.0–17.0)
MCH: 31.6 pg (ref 26.0–34.0)
MCHC: 33.5 g/dL (ref 30.0–36.0)
MCV: 94.3 fL (ref 80.0–100.0)
Platelets: 167 10*3/uL (ref 150–400)
RBC: 4.91 MIL/uL (ref 4.22–5.81)
RDW: 11.9 % (ref 11.5–15.5)
WBC: 5.1 10*3/uL (ref 4.0–10.5)
nRBC: 0 % (ref 0.0–0.2)

## 2022-11-04 LAB — BASIC METABOLIC PANEL
Anion gap: 13 (ref 5–15)
BUN: 22 mg/dL (ref 8–23)
CO2: 29 mmol/L (ref 22–32)
Calcium: 9.8 mg/dL (ref 8.9–10.3)
Chloride: 96 mmol/L — ABNORMAL LOW (ref 98–111)
Creatinine, Ser: 0.91 mg/dL (ref 0.61–1.24)
GFR, Estimated: >60 mL/min (ref >60)
Glucose, Bld: 100 mg/dL — ABNORMAL HIGH (ref 70–99)
Potassium: 3.2 mmol/L — ABNORMAL LOW (ref 3.5–5.1)
Sodium: 138 mmol/L (ref 135–145)

## 2022-11-04 LAB — POCT ACTIVATED CLOTTING TIME
Activated Clotting Time: 330 seconds
Activated Clotting Time: 379 seconds
Activated Clotting Time: 244 seconds

## 2022-11-04 LAB — ECHOCARDIOGRAM COMPLETE
AR max vel: 3.58 cm2
AV Area VTI: 3.97 cm2
AV Area mean vel: 4.24 cm2
AV Mean grad: 2 mmHg
AV Peak grad: 3.6 mmHg
Ao pk vel: 0.95 m/s
Area-P 1/2: 5.23 cm2
Height: 68 in
MV VTI: 3.19 cm2
S' Lateral: 2.2 cm
Weight: 2171.2 [oz_av]

## 2022-11-04 LAB — HEPARIN LEVEL (UNFRACTIONATED): Heparin Unfractionated: 0.88 [IU]/mL — ABNORMAL HIGH (ref 0.30–0.70)

## 2022-11-04 LAB — APTT: aPTT: 47 s — ABNORMAL HIGH (ref 24–36)

## 2022-11-04 SURGERY — LEFT HEART CATH AND CORONARY ANGIOGRAPHY
Anesthesia: Moderate Sedation

## 2022-11-04 MED ORDER — SODIUM CHLORIDE 0.9% FLUSH: 3.0000 mL | INTRAVENOUS | Status: DC | PRN

## 2022-11-04 MED ORDER — CLOPIDOGREL BISULFATE 75 MG PO TABS
ORAL_TABLET | ORAL | Status: DC | PRN
  Administered 2022-11-04: 600 mg via ORAL

## 2022-11-04 MED ORDER — SODIUM CHLORIDE 0.9 % WEIGHT BASED INFUSION
1.0000 mL/kg/h | INTRAVENOUS | Status: AC
  Administered 2022-11-04: 1 mL/kg/h via INTRAVENOUS

## 2022-11-04 MED ORDER — HEPARIN (PORCINE) IN NACL 2000-0.9 UNIT/L-% IV SOLN
INTRAVENOUS | Status: DC | PRN
  Administered 2022-11-04: 1000 mL

## 2022-11-04 MED ORDER — AMIODARONE HCL IN DEXTROSE 360-4.14 MG/200ML-% IV SOLN: 60.0000 mg/h | INTRAVENOUS | Status: DC

## 2022-11-04 MED ORDER — HEPARIN (PORCINE) IN NACL 1000-0.9 UT/500ML-% IV SOLN
INTRAVENOUS | Status: AC
  Filled 2022-11-04: qty 1000

## 2022-11-04 MED ORDER — SODIUM CHLORIDE 0.9 % WEIGHT BASED INFUSION
3.0000 mL/kg/h | INTRAVENOUS | Status: AC
  Administered 2022-11-04: 3 mL/kg/h via INTRAVENOUS

## 2022-11-04 MED ORDER — VERAPAMIL HCL 2.5 MG/ML IV SOLN
INTRAVENOUS | Status: DC | PRN
  Administered 2022-11-04: 2.5 mg via INTRAVENOUS

## 2022-11-04 MED ORDER — DILTIAZEM HCL 25 MG/5ML IV SOLN
INTRAVENOUS | Status: DC | PRN
  Administered 2022-11-04: 15 mg via INTRAVENOUS

## 2022-11-04 MED ORDER — ASPIRIN 81 MG PO CHEW
81.0000 mg | CHEWABLE_TABLET | ORAL | Status: AC
  Administered 2022-11-04: 81 mg via ORAL

## 2022-11-04 MED ORDER — SODIUM CHLORIDE 0.9% FLUSH: 3.0000 mL | Freq: Two times a day (BID) | INTRAVENOUS | Status: DC

## 2022-11-04 MED ORDER — AMIODARONE HCL 150 MG/3ML IV SOLN
INTRAVENOUS | Status: DC | PRN
  Administered 2022-11-04: 300 mg via INTRAVENOUS

## 2022-11-04 MED ORDER — DILTIAZEM HCL 25 MG/5ML IV SOLN
INTRAVENOUS | Status: AC
  Filled 2022-11-04: qty 5

## 2022-11-04 MED ORDER — POTASSIUM CHLORIDE CRYS ER 20 MEQ PO TBCR
EXTENDED_RELEASE_TABLET | ORAL | Status: AC
  Filled 2022-11-04: qty 1

## 2022-11-04 MED ORDER — CLOPIDOGREL BISULFATE 75 MG PO TABS
75.0000 mg | ORAL_TABLET | Freq: Every day | ORAL | Status: DC
  Administered 2022-11-05: 75 mg via ORAL
  Filled 2022-11-04: qty 1

## 2022-11-04 MED ORDER — VERAPAMIL HCL 2.5 MG/ML IV SOLN
INTRAVENOUS | Status: AC
  Filled 2022-11-04: qty 2

## 2022-11-04 MED ORDER — SODIUM CHLORIDE 0.9 % WEIGHT BASED INFUSION: 1.0000 mL/kg/h | INTRAVENOUS | Status: DC

## 2022-11-04 MED ORDER — HYDRALAZINE HCL 20 MG/ML IJ SOLN: 10.0000 mg | INTRAMUSCULAR | Status: AC | PRN

## 2022-11-04 MED ORDER — MIDAZOLAM HCL 2 MG/2ML IJ SOLN
INTRAMUSCULAR | Status: AC
  Filled 2022-11-04: qty 2

## 2022-11-04 MED ORDER — HEPARIN BOLUS VIA INFUSION
1800.0000 [IU] | Freq: Once | INTRAVENOUS | Status: AC
  Administered 2022-11-04: 1800 [IU] via INTRAVENOUS
  Filled 2022-11-04: qty 1800

## 2022-11-04 MED ORDER — ONDANSETRON HCL 4 MG/2ML IJ SOLN
4.0000 mg | Freq: Four times a day (QID) | INTRAMUSCULAR | Status: DC | PRN
  Administered 2022-11-04: 4 mg via INTRAVENOUS

## 2022-11-04 MED ORDER — HEPARIN SODIUM (PORCINE) 1000 UNIT/ML IJ SOLN
INTRAMUSCULAR | Status: DC | PRN
  Administered 2022-11-04: 7000 [IU] via INTRAVENOUS
  Administered 2022-11-04 (×2): 3000 [IU] via INTRAVENOUS

## 2022-11-04 MED ORDER — METOPROLOL SUCCINATE ER 25 MG PO TB24
25.0000 mg | ORAL_TABLET | Freq: Every day | ORAL | Status: DC
  Filled 2022-11-04: qty 1

## 2022-11-04 MED ORDER — METOPROLOL TARTRATE 5 MG/5ML IV SOLN: 5.0000 mg | INTRAVENOUS | Status: DC | PRN

## 2022-11-04 MED ORDER — SODIUM CHLORIDE 0.9 % IV SOLN
INTRAVENOUS | Status: AC | PRN
  Administered 2022-11-04: 250 mL via INTRAVENOUS

## 2022-11-04 MED ORDER — MIDAZOLAM HCL 2 MG/2ML IJ SOLN
INTRAMUSCULAR | Status: DC | PRN
  Administered 2022-11-04: .5 mg via INTRAVENOUS
  Administered 2022-11-04: 1 mg via INTRAVENOUS

## 2022-11-04 MED ORDER — SODIUM CHLORIDE 0.9 % IV SOLN: 250.0000 mL | INTRAVENOUS | Status: DC | PRN

## 2022-11-04 MED ORDER — DILTIAZEM HCL 60 MG PO TABS
60.0000 mg | ORAL_TABLET | Freq: Four times a day (QID) | ORAL | Status: DC
  Administered 2022-11-04: 60 mg via ORAL
  Filled 2022-11-04: qty 1

## 2022-11-04 MED ORDER — LABETALOL HCL 5 MG/ML IV SOLN: 10.0000 mg | INTRAVENOUS | Status: AC | PRN

## 2022-11-04 MED ORDER — ASPIRIN 81 MG PO CHEW
81.0000 mg | CHEWABLE_TABLET | Freq: Every day | ORAL | Status: DC
  Administered 2022-11-05: 81 mg via ORAL
  Filled 2022-11-04: qty 1

## 2022-11-04 MED ORDER — ACETAMINOPHEN 325 MG PO TABS: 650.0000 mg | ORAL_TABLET | ORAL | Status: DC | PRN

## 2022-11-04 MED ORDER — LOSARTAN POTASSIUM 25 MG PO TABS
12.5000 mg | ORAL_TABLET | Freq: Every day | ORAL | Status: DC
  Administered 2022-11-04: 12.5 mg via ORAL
  Filled 2022-11-04: qty 1

## 2022-11-04 MED ORDER — ROSUVASTATIN CALCIUM 10 MG PO TABS
20.0000 mg | ORAL_TABLET | Freq: Every day | ORAL | Status: DC
  Administered 2022-11-04 – 2022-11-05 (×2): 20 mg via ORAL
  Filled 2022-11-04 (×2): qty 2

## 2022-11-04 MED ORDER — METOPROLOL TARTRATE 5 MG/5ML IV SOLN: 5.0000 mg | Freq: Four times a day (QID) | INTRAVENOUS | Status: DC

## 2022-11-04 MED ORDER — ASPIRIN 81 MG PO CHEW
CHEWABLE_TABLET | ORAL | Status: AC
  Filled 2022-11-04: qty 1

## 2022-11-04 MED ORDER — AMIODARONE HCL IN DEXTROSE 360-4.14 MG/200ML-% IV SOLN
INTRAVENOUS | Status: AC
  Filled 2022-11-04: qty 200

## 2022-11-04 MED ORDER — POTASSIUM CHLORIDE CRYS ER 20 MEQ PO TBCR
20.0000 meq | EXTENDED_RELEASE_TABLET | Freq: Once | ORAL | Status: AC
  Administered 2022-11-04: 20 meq via ORAL

## 2022-11-04 MED ORDER — LIDOCAINE HCL 1 % IJ SOLN
INTRAMUSCULAR | Status: AC
  Filled 2022-11-04: qty 20

## 2022-11-04 MED ORDER — FENTANYL CITRATE (PF) 100 MCG/2ML IJ SOLN
INTRAMUSCULAR | Status: AC
  Filled 2022-11-04: qty 2

## 2022-11-04 MED ORDER — HEPARIN (PORCINE) 25000 UT/250ML-% IV SOLN: 950.0000 [IU]/h | INTRAVENOUS | Status: DC

## 2022-11-04 MED ORDER — HEPARIN SODIUM (PORCINE) 1000 UNIT/ML IJ SOLN
INTRAMUSCULAR | Status: AC
  Filled 2022-11-04: qty 10

## 2022-11-04 MED ORDER — CLOPIDOGREL BISULFATE 75 MG PO TABS
ORAL_TABLET | ORAL | Status: AC
  Filled 2022-11-04: qty 8

## 2022-11-04 MED ORDER — ONDANSETRON HCL 4 MG/2ML IJ SOLN
INTRAMUSCULAR | Status: AC
  Filled 2022-11-04: qty 2

## 2022-11-04 MED ORDER — AMIODARONE HCL IN DEXTROSE 360-4.14 MG/200ML-% IV SOLN
30.0000 mg/h | INTRAVENOUS | Status: DC
  Administered 2022-11-04 – 2022-11-05 (×2): 30 mg/h via INTRAVENOUS
  Filled 2022-11-04: qty 200

## 2022-11-04 MED ORDER — HEPARIN (PORCINE) 25000 UT/250ML-% IV SOLN
15.0000 [IU]/kg/h | INTRAVENOUS | Status: DC
  Administered 2022-11-04: 15 [IU]/kg/h via INTRAVENOUS

## 2022-11-04 MED ORDER — AMIODARONE HCL 150 MG/3ML IV SOLN
INTRAVENOUS | Status: AC
  Filled 2022-11-04: qty 6

## 2022-11-04 MED ORDER — FENTANYL CITRATE (PF) 100 MCG/2ML IJ SOLN
INTRAMUSCULAR | Status: DC | PRN
  Administered 2022-11-04: 25 ug via INTRAVENOUS

## 2022-11-04 MED ORDER — APIXABAN 5 MG PO TABS
5.0000 mg | ORAL_TABLET | Freq: Two times a day (BID) | ORAL | Status: DC
  Administered 2022-11-05: 5 mg via ORAL
  Filled 2022-11-04: qty 1

## 2022-11-04 SURGICAL SUPPLY — 25 items
BALLN EUPHORA RX 2.5X15 (BALLOONS) ×1
BALLN TREK RX 2.25X15 (BALLOONS) ×1
BALLN ~~LOC~~ TREK NEO RX 3.5X20 (BALLOONS) IMPLANT
BALLN ~~LOC~~ TREK NEO RX 4.0X12 (BALLOONS) IMPLANT
BALLOON EUPHORA RX 2.5X15 (BALLOONS) IMPLANT
BALLOON TREK RX 2.25X15 (BALLOONS) IMPLANT
CATH 5FR JL3.5 JR4 ANG PIG MP (CATHETERS) IMPLANT
CATH TELESCOPE 6F GEC (CATHETERS) IMPLANT
CATH VISTA GUIDE 6FR JR4 (CATHETERS) IMPLANT
DEVICE RAD TR BAND REGULAR (VASCULAR PRODUCTS) IMPLANT
DRAPE BRACHIAL (DRAPES) IMPLANT
GLIDESHEATH SLEND SS 6F .021 (SHEATH) IMPLANT
GUIDEWIRE INQWIRE 1.5J.035X260 (WIRE) IMPLANT
INQWIRE 1.5J .035X260CM (WIRE)
KIT ENCORE 26 ADVANTAGE (KITS) IMPLANT
PACK CARDIAC CATH (CUSTOM PROCEDURE TRAY) ×1 IMPLANT
PROTECTION STATION PRESSURIZED (MISCELLANEOUS) ×1
SET ATX-X65L (MISCELLANEOUS) IMPLANT
STATION PROTECTION PRESSURIZED (MISCELLANEOUS) IMPLANT
STENT ONYX FRONTIER 3.0X26 (Permanent Stent) IMPLANT
STENT ONYX FRONTIER 3.5X15 (Permanent Stent) IMPLANT
TUBING CIL FLEX 10 FLL-RA (TUBING) IMPLANT
WIRE ASAHI PROWATER 180CM (WIRE) IMPLANT
WIRE G HI TQ BMW 190 (WIRE) IMPLANT
WIRE ROSEN-J .035X260CM (WIRE) IMPLANT

## 2022-11-04 NOTE — Progress Notes (Signed)
ANTICOAGULATION CONSULT NOTE   Pharmacy Consult for heparin Indication: chest pain/ACS  No Known Allergies  Patient Measurements: Height: 5\' 8"  (172.7 cm) Weight: 61.6 kg (135 lb 11.2 oz) IBW/kg (Calculated) : 68.4 Heparin Dosing Weight: 61.6 kg  Vital Signs: Temp: 97.7 F (36.5 C) (08/22 0239) Temp Source: Oral (08/22 0239) BP: 110/76 (08/22 0239) Pulse Rate: 75 (08/22 0239)  Labs: Recent Labs    11/03/22 1546 11/03/22 1733 11/03/22 2126 11/04/22 0445  HGB 16.6  --   --  15.5  HCT 50.4  --   --  46.3  PLT 181  --   --  167  APTT  --   --  38* 47*  LABPROT  --   --  15.5*  --   INR  --   --  1.2  --   HEPARINUNFRC  --   --  1.04* 0.88*  CREATININE 0.92  --   --  0.91  TROPONINIHS 5,072* 4,809*  --   --    Estimated Creatinine Clearance: 52.6 mL/min (by C-G formula based on SCr of 0.91 mg/dL).  Medical History: Past Medical History:  Diagnosis Date   CAD (coronary artery disease)    Compression fracture h/o   COPD (chronic obstructive pulmonary disease) (HCC)    Edema    VERY MILD OF CALF OCCAS   Heart murmur    Hyperlipemia    Hypertension    MI, old 2001   lad stent   Shortness of breath dyspnea    DOE   Assessment: 87 y/o male presenting with tachycardia and chest pain. PMH significant for atrial fibrillation (on Eliquis PTA), history of STEMI (2001) with stent to LAD, history of TIA (2013). Pharmacy has been consulted to initiate heparin infusion.  Per patient, last dose of apixaban taken on 11/03/22 at approximately 0800. Given recent use of DOAC, will start heparin infusion without bolus and will monitor with Anti-Xa and aPTT levels until correlating. Hgb 16.6, plt WNL. No signs/symptoms of bleeding noted.   Goal of Therapy:  Heparin level 0.3-0.7 units/ml aPTT 66-102 seconds Monitor platelets by anticoagulation protocol: Yes   08/22 0445 aPTT 47 subtherapeutic / HL 0.88, not correlating  Plan: Bolus 1800 units x 1 Increase heparin infusion to  950 units/hr Recheck aPTT in 8 hrs after rate change Monitor daily aPTT and Anti-Xa levels until correlating Monitor CBC and signs/symptoms of bleeding  Thank you for involving pharmacy in this patient's care.   Otelia Sergeant, PharmD, [facility] 11/04/2022 5:28 AM

## 2022-11-04 NOTE — Consult Note (Addendum)
[facility] CLINIC CARDIOLOGY CONSULT NOTE       Patient ID: Hunter Carey MRN: [id number] DOB/AGE: Apr 10, 1935 87 y.o.  Admit date: 11/03/2022 Referring Physician Dr. Londell Moh  Primary Physician Dr. Sampson Goon Primary Cardiologist Dr. Darrold Junker Reason for Consultation atrial flutter RVR, NSTEMI  HPI: Hunter Carey is a 87 y.o. male  with a past medical history of coronary artery disease s/p STEMI 2001 with PCI and DES to proximal LAD with residual 50% mid LAD, 50% ostial RCA, 50% mid RCA lesions, paroxysmal atrial fibrillation, sick sinus syndrome s/p permanent pacemaker 08/31/2022, sinus bradycardia, hypertension, hyperlipidemia, history of TIA who presented to the ED on 11/03/2022 for palpitations and fast heart rate.  Cardiology was consulted for further evaluation of atrial flutter with RVR, NSTEMI.   Patient states that on Sunday he felt like he went into atrial fibrillation.  States that he felt like his heart was racing and had palpitation symptoms.  At this time he made an appointment to be seen in our office.  On Tuesday he began experiencing chest "tightness" that would come and go throughout the day.  Episodes would last around an hour and typically resolve on their own.  He denies any associated shortness of breath, dizziness, syncope, fatigue.  He was evaluated in our clinic yesterday at which time he was complaining of chest discomfort and was noted to be in atrial flutter with RVR on EKG and thus was sent to the ED for further evaluation.  Upon presentation to the ED EKG revealed atrial flutter with heart rate of 151 bpm.  Workup in the ED notable for a potassium of 3.2, creatinine 0.92, GFR >60.  Troponin elevated to 5072.  Patient was initiated on diltiazem infusion for heart rate control and heparin infusion for NSTEMI.  The time of my evaluation this morning patient reports that his chest pain has resolved and denies any recurrence since yesterday.  Heart rate much better on  telemetry in the 80s to 90s but remains in atrial flutter with variable block.  Denies any palpitation symptoms today.   Review of systems complete and found to be negative unless listed above     Past Medical History:  Diagnosis Date   CAD (coronary artery disease)    Compression fracture h/o   COPD (chronic obstructive pulmonary disease) (HCC)    Edema    VERY MILD OF CALF OCCAS   Heart murmur    Hyperlipemia    Hypertension    MI, old 2001   lad stent   Shortness of breath dyspnea    DOE    Past Surgical History:  Procedure Laterality Date   CATARACT EXTRACTION W/PHACO Left 03/24/2015   Procedure: CATARACT EXTRACTION PHACO AND INTRAOCULAR LENS PLACEMENT (IOC);  Surgeon: Sallee Lange, MD;  Location: [facility] ORS;  Service: Ophthalmology;  Laterality: Left;  [place] 01:06 AP% 21.7 CDE 29.12 fluid pack lot # [id number]   CATARACT EXTRACTION W/PHACO Right 02/21/2018   Procedure: CATARACT EXTRACTION PHACO AND INTRAOCULAR LENS PLACEMENT (IOC) RIGHT;  Surgeon: Nevada Crane, MD;  Location: [facility];  Service: Ophthalmology;  Laterality: Right;   COLONOSCOPY WITH PROPOFOL N/A 08/25/2015   Procedure: COLONOSCOPY WITH PROPOFOL;  Surgeon: Scot Jun, MD;  Location: [facility] ENDOSCOPY;  Service: Endoscopy;  Laterality: N/A;   CORONARY ANGIOPLASTY WITH STENT PLACEMENT     CORONARY PRESSURE/FFR STUDY N/A 10/30/2020   Procedure: INTRAVASCULAR PRESSURE WIRE/FFR STUDY;  Surgeon: Marcina Millard, MD;  Location: [facility] INVASIVE CV LAB;  Service: Cardiovascular;  Laterality: N/A;  RCA   HERNIA REPAIR     LEFT HEART CATH AND CORONARY ANGIOGRAPHY Left 08/15/2018   Procedure: LEFT HEART CATH AND CORONARY ANGIOGRAPHY;  Surgeon: Marcina Millard, MD;  Location: [facility] INVASIVE CV LAB;  Service: Cardiovascular;  Laterality: Left;   LEFT HEART CATH AND CORONARY ANGIOGRAPHY N/A 10/30/2020   Procedure: LEFT HEART CATH AND CORONARY ANGIOGRAPHY;  Surgeon: Marcina Millard, MD;  Location:  [facility] INVASIVE CV LAB;  Service: Cardiovascular;  Laterality: N/A;   PACEMAKER IMPLANT N/A 08/31/2022   Procedure: PACEMAKER IMPLANT;  Surgeon: Marcina Millard, MD;  Location: [facility] INVASIVE CV LAB;  Service: Cardiovascular;  Laterality: N/A;   VASECTOMY      (Not in a hospital admission)  Social History   Socioeconomic History   Marital status: Married    Spouse name: Not on file   Number of children: Not on file   Years of education: Not on file   Highest education level: Not on file  Occupational History   Not on file  Tobacco Use   Smoking status: Former   Smokeless tobacco: Never  Substance and Sexual Activity   Alcohol use: Yes    Alcohol/week: 7.0 standard drinks of alcohol    Types: 7 Glasses of wine per week   Drug use: No   Sexual activity: Yes  Other Topics Concern   Not on file  Social History Narrative   Not on file   Social Determinants of Health   Financial Resource Strain: Low Risk  (09/23/2020)   Received [facility], [facility]   Overall Financial Resource Strain (CARDIA)    Difficulty of Paying Living Expenses: Not hard at all  Food Insecurity: No Food Insecurity (09/23/2020)   Received from [facility], [facility]   Hunger Vital Sign    Worried About Running Out of Food in the Last Year: Never true    Ran Out of Food in the Last Year: Never true  Transportation Needs: No Transportation Needs (09/23/2020)   Received from [facility], [facility]   [facility] - Transportation    Lack of Transportation (Medical): No    Lack of Transportation (Non-Medical): No  Physical Activity: Not on file  Stress: Not on file  Social Connections: Not on file  Intimate Partner Violence: Not on file    Family History  Problem Relation Age of Onset   Hypertension Mother      Vitals:   11/04/22 0940 11/04/22 1000 11/04/22 1039 11/04/22 1121  BP:  112/66  138/76  Pulse:  78 91 (!) 108  Resp:  (!) 23 (!) 22 (!) 24  Temp: 97.7 F  (36.5 C)   97.9 F (36.6 C)  TempSrc: Oral   Oral  SpO2:  (!) 72% 94% 94%  Weight:    62.6 kg  Height:    5\' 8"  (1.727 m)    PHYSICAL EXAM General: well-appearing elderly male, well nourished, in no acute distress sitting upright in ED stretcher. HEENT: Normocephalic and atraumatic. Neck: No JVD.  Lungs: Normal respiratory effort on room air. Clear bilaterally to auscultation. No wheezes, crackles, rhonchi.  Heart: Irregularly irregular. Normal S1 and S2 without gallops or murmurs.  Abdomen: Non-distended appearing.  Msk: Normal strength and tone for age. Extremities: Warm and well perfused. No clubbing, cyanosis. No edema.  Neuro: Alert and oriented X 3. Psych: Answers questions appropriately.   Labs: Basic Metabolic Panel: Recent Labs    11/03/22 1546 11/04/22 0445  NA 137 138  K 3.2* 3.2*  CL 99 96*  CO2 30 29  GLUCOSE 94 100*  BUN 20 22  CREATININE 0.92 0.91  CALCIUM 9.9 9.8   Liver Function Tests: No results for input(s): "AST", "ALT", "ALKPHOS", "BILITOT", "PROT", "ALBUMIN" in the last 72 hours. No results for input(s): "LIPASE", "AMYLASE" in the last 72 hours. CBC: Recent Labs    11/03/22 1546 11/04/22 0445  WBC 5.6 5.1  HGB 16.6 15.5  HCT 50.4 46.3  MCV 95.8 94.3  PLT 181 167   Cardiac Enzymes: Recent Labs    11/03/22 1546 11/03/22 1733  TROPONINIHS 5,072* 4,809*   BNP: No results for input(s): "BNP" in the last 72 hours. D-Dimer: No results for input(s): "DDIMER" in the last 72 hours. Hemoglobin A1C: No results for input(s): "HGBA1C" in the last 72 hours. Fasting Lipid Panel: No results for input(s): "CHOL", "HDL", "LDLCALC", "TRIG", "CHOLHDL", "LDLDIRECT" in the last 72 hours. Thyroid Function Tests: No results for input(s): "TSH", "T4TOTAL", "T3FREE", "THYROIDAB" in the last 72 hours.  Invalid input(s): "FREET3" Anemia Panel: No results for input(s): "VITAMINB12", "FOLATE", "FERRITIN", "TIBC", "IRON", "RETICCTPCT" in the last 72  hours.   Radiology: DG Chest 2 View  Result Date: 11/03/2022 CLINICAL DATA:  CP.  Tachycardia. EXAM: CHEST - 2 VIEW COMPARISON:  08/31/2022. FINDINGS: Bilateral lungs appear hyperexpanded and hyperlucent with coarse bronchovascular markings, concerning for COPD. Bilateral lungs otherwise appear clear. No dense consolidation. Bilateral costophrenic angles are clear. Normal cardio-mediastinal silhouette. There is a left sided 2-lead pacemaker. No acute osseous abnormalities. The soft tissues are within normal limits. IMPRESSION: 1. No active cardiopulmonary disease. 2. Probable COPD. Electronically Signed   By: Jules Schick M.D.   On: 11/03/2022 17:02    ECHO pending  TELEMETRY reviewed by me [facility]) 11/04/2022 : atrial flutter rate 80-90s  EKG reviewed by me: atrial flutter RVR rate 151 bpm  Data reviewed by me [facility]) 11/04/2022: last 24h vitals tele labs imaging I/O ED provider note, admission H&P note  Principal Problem:   NSTEMI (non-ST elevated myocardial infarction) [facility]) Active Problems:   Chest pain   Sick sinus syndrome (HCC)   Paroxysmal A-fib (HCC)   CAD S/P percutaneous coronary angioplasty    ASSESSMENT AND PLAN:  DREYDEN MUMAW is a 87 y.o. male  with a past medical history of coronary artery disease s/p STEMI 2001 with PCI and DES to proximal LAD with residual 50% mid LAD, 50% ostial RCA, 50% mid RCA lesions, paroxysmal atrial fibrillation, sick sinus syndrome s/p permanent pacemaker 08/31/2022, sinus bradycardia, hypertension, hyperlipidemia, history of TIA who presented to the ED on 11/03/2022 for palpitations and fast heart rate.  Cardiology was consulted for further evaluation of atrial flutter with RVR, NSTEMI.   # Atrial flutter RVR # Paroxysmal atrial fibrillation # Sick sinus syndrome s/p PPM 08/31/2022 Rate on presentation to the ED in the 150s, patient initiated on diltizem infusion and rate improved to 80-90s this morning on telemetry. -Echo pending -Home Eliquis held  currently as he is on heparin infusion. -Will transition diltiazem infusion to p.o. 60 mg every 6 hours. -IV Lopressor 5 mg as needed for elevated heart rate. -Consider outpatient referral to EP for further evaluation of atrial flutter.  # Chest pain # NSTEMI # CAD s/p STEMI 2001 with PCI to prox LAD With episodes of chest pain over the last 3 days, troponin elevated in the ED to 5072 yesterday and downtrending to 4809.  EKG with rapid atrial flutter.  Plan for left heart cath this afternoon for additional evaluation given significant history of coronary disease and elevated troponins. -Continue heparin infusion per pharmacy. -Discussed the risks and benefits of proceeding with LHC for further evaluation with the patient.  He is agreeable to proceed.  NPO until LHC this afternoon (8/22) with Dr. Darrold Junker.  Written consent will be obtained.  Further recommendations following LHC.    # Hypertension # Hyperlipidemia Blood pressure has been controlled since admission.  Home meds are currently being held.  Lipid panel from February with TC 135, LDL 59, HDL 64, triglycerides 59.  -Continue simvastatin 5 mg daily  # Hypokalemia Potassium noted to be 3.2 on admission yesterday and again this morning. -Supplement potassium today. -Monitor and replenish electrolytes for a goal K >4, Mag >2    This patient's plan of care was discussed and created with Dr. Melton Alar and she is in agreement.  Signed: Cheryl Flash, PA-C 11/04/2022, 11:47 AM [facility] Card[facility]

## 2022-11-04 NOTE — Plan of Care (Signed)

## 2022-11-04 NOTE — ED Notes (Signed)
Report given to specials 

## 2022-11-04 NOTE — ED Notes (Addendum)
Heparin being discontinued was a mistake. Was not stopped. Still running at same rate

## 2022-11-04 NOTE — Progress Notes (Signed)
PROGRESS NOTE    Hunter Carey   ZOX:[id number] DOB: [birth year]  DOA: 11/03/2022 Date of Service: 11/04/22 PCP: Mick Sell, MD     Brief Narrative / Hospital Course:  Hunter Carey is an 87 year old male with history of sick sinus syndrome status post pacemaker placement, hypertension, hyperlipidemia, anticoagulation use with Eliquis, history of CAD status post STEMI requiring stent placement to the LAD, history of sinus bradycardia, history of TIA, who presents to the ED for chief concerns of heart palpitation. 08/21: in ED, (+)aflutter, placed on dilt drip. Troponin 5072, started heparin. Cardiology consulted.  08/22: LHC w/ stent, converted to NSR< monitor overnight     Consultants:  Cardiology   Procedures: 11/04/22 left cardiac cath and stent placement       ASSESSMENT & PLAN:   Principal Problem:   NSTEMI (non-ST elevated myocardial infarction) [facility]) Active Problems:   Chest pain   Sick sinus syndrome (HCC)   Paroxysmal A-fib (HCC)   CAD S/P percutaneous coronary angioplasty   NSTEMI (non-ST elevated myocardial infarction) (HCC) Chest pain CAD S/P percutaneous coronary angioplasty 2001 heparin per pharmacy Trend troponin Echocardiogram  Cardiology plan for LHC today --> stent x2 to RCA Morphine 2 mg IV every 4 hours as needed for moderate, severe, chest pain. Patient states that his outpatient cardiologist states he cannot have any nitroglycerin  Paroxysmal A-fib (HCC), rapid response  Was on dilt gtt --> converted to NSR in cath procedure  Home Eliquis not resumed on admission since on heparin for NSTEMI    Sick sinus syndrome (HCC) Status post [employer] dual-chamber pacemaker placement on 08/31/2022 Interrogate pacer per cardiology    DVT prophylaxis: heparin Pertinent IV fluids/nutrition: can d/c continuous fluids if tolerating diet  Central lines / invasive devices: none  Code Status: FULL CODE ACP documentation reviewed: none on  file   Current Admission Status: inpatient  TOC needs / Dispo plan: TBD Barriers to discharge / significant pending items: per cardiology anticipate home tomorrow if he does well overnight              Subjective / Brief ROS:  Patient reports feeling better this afternoon, he is exmined in special procedures area post-cath  Denies CP/SOB.  Pain controlled.  Denies new weakness. .  Reports no concerns w/ urination/defecation.   Family Communication: wife at bedside on rounds     Objective Findings:  Vitals:   11/04/22 1525 11/04/22 1530 11/04/22 1545 11/04/22 1600  BP: 125/69 122/69 127/74 129/73  Pulse: 60  (!) 58 60  Resp: 20 (!) 22 (!) 26 15  Temp:      TempSrc:      SpO2:  96% 96% 98%  Weight:      Height:       No intake or output data in the 24 hours ending 11/04/22 1607 Filed Weights   11/03/22 1655 11/04/22 1121  Weight: 61.6 kg 62.6 kg    Examination:  Physical Exam Constitutional:      General: He is not in acute distress. Cardiovascular:     Rate and Rhythm: Normal rate and regular rhythm.  Pulmonary:     Effort: Pulmonary effort is normal.     Breath sounds: Normal breath sounds.  Neurological:     Mental Status: He is alert.  Psychiatric:        Mood and Affect: Mood normal.          Scheduled Medications:   [START ON 11/05/2022] apixaban  5 mg  Oral BID   [START ON 11/05/2022] aspirin  81 mg Oral Daily   [START ON 11/05/2022] clopidogrel  75 mg Oral Q breakfast   [START ON 11/05/2022] metoprolol succinate  25 mg Oral Daily   [MAR Hold] simvastatin  5 mg Oral QHS   sodium chloride flush  3 mL Intravenous Q12H   [START ON 11/05/2022] sodium chloride flush  3 mL Intravenous Q12H    Continuous Infusions:  sodium chloride     [START ON 11/05/2022] sodium chloride     sodium chloride     sodium chloride     sodium chloride     heparin Stopped (11/04/22 1140)    PRN Medications:  sodium chloride, [START ON 11/05/2022] sodium  chloride, sodium chloride, acetaminophen, amiodarone, clopidogrel, diltiazem, fentaNYL, Heparin (Porcine) in NaCl, heparin sodium (porcine), hydrALAZINE, labetalol, [MAR Hold] metoprolol tartrate, midazolam, [MAR Hold]  morphine injection, ondansetron (ZOFRAN) IV, [MAR Hold] ondansetron **OR** [DISCONTINUED] ondansetron (ZOFRAN) IV, [MAR Hold] senna-docusate, sodium chloride flush, [START ON 11/05/2022] sodium chloride flush, verapamil  Antimicrobials from admission:  Anti-infectives (From admission, onward)    None           Data Reviewed:  I have personally reviewed the following...  CBC: Recent Labs  Lab 11/03/22 1546 11/04/22 0445  WBC 5.6 5.1  HGB 16.6 15.5  HCT 50.4 46.3  MCV 95.8 94.3  PLT 181 167   Basic Metabolic Panel: Recent Labs  Lab 11/03/22 1546 11/04/22 0445  NA 137 138  K 3.2* 3.2*  CL 99 96*  CO2 30 29  GLUCOSE 94 100*  BUN 20 22  CREATININE 0.92 0.91  CALCIUM 9.9 9.8   GFR: Estimated Creatinine Clearance: 53.5 mL/min (by C-G formula based on SCr of 0.91 mg/dL). Liver Function Tests: No results for input(s): "AST", "ALT", "ALKPHOS", "BILITOT", "PROT", "ALBUMIN" in the last 168 hours. No results for input(s): "LIPASE", "AMYLASE" in the last 168 hours. No results for input(s): "AMMONIA" in the last 168 hours. Coagulation Profile: Recent Labs  Lab 11/03/22 2126  INR 1.2   Cardiac Enzymes: No results for input(s): "CKTOTAL", "CKMB", "CKMBINDEX", "TROPONINI" in the last 168 hours. BNP (last 3 results) No results for input(s): "PROBNP" in the last 8760 hours. HbA1C: No results for input(s): "HGBA1C" in the last 72 hours. CBG: No results for input(s): "GLUCAP" in the last 168 hours. Lipid Profile: No results for input(s): "CHOL", "HDL", "LDLCALC", "TRIG", "CHOLHDL", "LDLDIRECT" in the last 72 hours. Thyroid Function Tests: No results for input(s): "TSH", "T4TOTAL", "FREET4", "T3FREE", "THYROIDAB" in the last 72 hours. Anemia Panel: No  results for input(s): "VITAMINB12", "FOLATE", "FERRITIN", "TIBC", "IRON", "RETICCTPCT" in the last 72 hours. Most Recent Urinalysis On File:     Component Value Date/Time   COLORURINE YELLOW (A) 02/23/2016 1100   APPEARANCEUR CLEAR (A) 02/23/2016 1100   LABSPEC 1.024 02/23/2016 1100   PHURINE 5.0 02/23/2016 1100   GLUCOSEU NEGATIVE 02/23/2016 1100   HGBUR SMALL (A) 02/23/2016 1100   BILIRUBINUR NEGATIVE 02/23/2016 1100   KETONESUR NEGATIVE 02/23/2016 1100   PROTEINUR NEGATIVE 02/23/2016 1100   NITRITE NEGATIVE 02/23/2016 1100   LEUKOCYTESUR NEGATIVE 02/23/2016 1100   Sepsis Labs: @LABRCNTIP (procalcitonin:4,lacticidven:4) Microbiology: No results found for this or any previous visit (from the past 240 hour(s)).    Radiology Studies last 3 days: CARDIAC CATHETERIZATION  Result Date: 11/04/2022   Prox Cx to Mid Cx lesion is 30% stenosed.   Prox RCA lesion is 30% stenosed.   Mid RCA lesion is 95% stenosed.  Ost 1st Mrg lesion is 50% stenosed.   Ost LAD to Prox LAD lesion is 20% stenosed.   Prox LAD to Mid LAD lesion is 30% stenosed.   Ost RCA lesion is 85% stenosed.   A drug-eluting stent was successfully placed using a STENT ONYX FRONTIER 3.0X26.   A drug-eluting stent was successfully placed using a STENT ONYX FRONTIER 3.5X15.   Post intervention, there is a 0% residual stenosis.   Post intervention, there is a 0% residual stenosis.   The left ventricular systolic function is normal.   The left ventricular ejection fraction is 50-55% by visual estimate. 1.  NSTEMI 2.  Patent stent proximal LAD 3.  85% stenosis ostial RCA, and 5% stenosis mid RCA 4.  Normal left ventricular function 5.  Successful PCI with 3.0 x 26 mm Onyx frontier DES mid RCA, and 3.5 x 15 mm Onyx frontier DES ostial RCA Recommendations 1.  Dual antiplatelet therapy x 2 weeks then DC aspirin 2.  Resume Eliquis 5 mg twice daily for stroke prevention 3.  Aggressive risk factor modification 4.  DC amiodarone infusion after  completion of 1 g load   ECHOCARDIOGRAM COMPLETE  Result Date: 11/04/2022    ECHOCARDIOGRAM REPORT   Patient Name:   Hunter Carey Date of Exam: 11/04/2022 Medical Rec #:  [id number]      Height:       68.0 in Accession #:    [id number]     Weight:       135.7 lb Date of Birth:  [birth year]     BSA:          1.733 m Patient Age:    86 years       BP:           112/66 mmHg Patient Gender: M              HR:           78 bpm. Exam Location:  [facility] Procedure: 2D Echo, Cardiac Doppler and Color Doppler Indications:     Chest pain R07.9                  Elevated troponin  History:         Patient has no prior history of Echocardiogram examinations.                  Previous Myocardial Infarction, COPD, Signs/Symptoms:Murmur;                  Risk Factors:Hypertension.  Sonographer:     Cristela Blue Referring Phys:  [id number] AMY N COX Diagnosing Phys: Rozell Searing Custovic  Sonographer Comments: Suboptimal parasternal window. IMPRESSIONS  1. Left ventricular ejection fraction, by estimation, is 55 to 60%. The left ventricle has normal function. The left ventricle has no regional wall motion abnormalities. Left ventricular diastolic parameters were normal.  2. Right ventricular systolic function is normal. The right ventricular size is normal.  3. The mitral valve is grossly normal. Trivial mitral valve regurgitation.  4. The aortic valve is grossly normal. Aortic valve regurgitation is not visualized. Aortic valve sclerosis/calcification is present, without any evidence of aortic stenosis. FINDINGS  Left Ventricle: Left ventricular ejection fraction, by estimation, is 55 to 60%. The left ventricle has normal function. The left ventricle has no regional wall motion abnormalities. The left ventricular internal cavity size was normal in size. There is  no left ventricular hypertrophy. Left ventricular diastolic parameters were normal. Right Ventricle: The  right ventricular size is normal. No increase in right ventricular wall  thickness. Right ventricular systolic function is normal. Left Atrium: Left atrial size was normal in size. Right Atrium: Right atrial size was normal in size. Pericardium: There is no evidence of pericardial effusion. Mitral Valve: The mitral valve is grossly normal. Trivial mitral valve regurgitation. MV peak gradient, 6.0 mmHg. The mean mitral valve gradient is 2.0 mmHg. Tricuspid Valve: The tricuspid valve is grossly normal. Tricuspid valve regurgitation is trivial. Aortic Valve: The aortic valve is grossly normal. Aortic valve regurgitation is not visualized. Aortic valve sclerosis/calcification is present, without any evidence of aortic stenosis. Aortic valve mean gradient measures 2.0 mmHg. Aortic valve peak gradient measures 3.6 mmHg. Aortic valve area, by VTI measures 3.97 cm. Pulmonic Valve: The pulmonic valve was grossly normal. Pulmonic valve regurgitation is not visualized. Aorta: The aortic root is normal in size and structure. IAS/Shunts: No atrial level shunt detected by color flow Doppler. Additional Comments: A device lead is visualized.  LEFT VENTRICLE PLAX 2D LVIDd:         3.80 cm   Diastology LVIDs:         2.20 cm   LV e' medial:   9.03 cm/s LV PW:         1.00 cm   LV E/e' medial: 10.7 LV IVS:        1.00 cm LVOT diam:     2.00 cm LV SV:         57 LV SV Index:   33 LVOT Area:     3.14 cm  RIGHT VENTRICLE RV Basal diam:  4.30 cm RV Mid diam:    3.30 cm LEFT ATRIUM             Index        RIGHT ATRIUM           Index LA diam:        3.30 cm 1.90 cm/m   RA Area:     18.20 cm LA Vol (A2C):   16.9 ml 9.75 ml/m   RA Volume:   53.20 ml  30.70 ml/m LA Vol (A4C):   23.3 ml 13.44 ml/m LA Biplane Vol: 20.8 ml 12.00 ml/m  AORTIC VALVE AV Area (Vmax):    3.58 cm AV Area (Vmean):   4.24 cm AV Area (VTI):     3.97 cm AV Vmax:           94.90 cm/s AV Vmean:          60.900 cm/s AV VTI:            0.144 m AV Peak Grad:      3.6 mmHg AV Mean Grad:      2.0 mmHg LVOT Vmax:         108.00 cm/s LVOT  Vmean:        82.200 cm/s LVOT VTI:          0.182 m LVOT/AV VTI ratio: 1.26  AORTA Ao Root diam: 2.10 cm MITRAL VALVE               TRICUSPID VALVE MV Area (PHT): 5.23 cm    TR Peak grad:   24.8 mmHg MV Area VTI:   3.19 cm    TR Vmax:        249.00 cm/s MV Peak grad:  6.0 mmHg MV Mean grad:  2.0 mmHg    SHUNTS MV Vmax:       1.22 m/s  Systemic VTI:  0.18 m MV Vmean:      66.2 cm/s   Systemic Diam: 2.00 cm MV Decel Time: 145 msec MV E velocity: 96.90 cm/s MV A velocity: 56.10 cm/s MV E/A ratio:  1.73 Designer, multimedia signed by Clotilde Dieter Signature Date/Time: 11/04/2022/11:54:50 AM    Final    DG Chest 2 View  Result Date: 11/03/2022 CLINICAL DATA:  CP.  Tachycardia. EXAM: CHEST - 2 VIEW COMPARISON:  08/31/2022. FINDINGS: Bilateral lungs appear hyperexpanded and hyperlucent with coarse bronchovascular markings, concerning for COPD. Bilateral lungs otherwise appear clear. No dense consolidation. Bilateral costophrenic angles are clear. Normal cardio-mediastinal silhouette. There is a left sided 2-lead pacemaker. No acute osseous abnormalities. The soft tissues are within normal limits. IMPRESSION: 1. No active cardiopulmonary disease. 2. Probable COPD. Electronically Signed   By: Jules Schick M.D.   On: 11/03/2022 17:02             LOS: 1 day        Sunnie Nielsen, DO [facility] 11/04/2022, 4:07 PM    Dictation software may have been used to generate the above note. Typos may occur and escape review in typed/dictated notes. Please contact Dr Lyn Hollingshead directly for clarity if needed.  Staff may message me via secure chat in Epic  but this may not receive an immediate response,  please page me for urgent matters!  If 7PM-7AM, please contact night coverage [email or web address]

## 2022-11-04 NOTE — Progress Notes (Signed)
*  PRELIMINARY RESULTS* Echocardiogram 2D Echocardiogram has been performed.  Hunter Carey 11/04/2022, 10:13 AM

## 2022-11-05 ENCOUNTER — Encounter: Payer: Self-pay | Admitting: Cardiology

## 2022-11-05 ENCOUNTER — Other Ambulatory Visit: Payer: Self-pay

## 2022-11-05 DIAGNOSIS — I214 Non-ST elevation (NSTEMI) myocardial infarction: Secondary | ICD-10-CM | POA: Diagnosis not present

## 2022-11-05 LAB — CBC
HCT: 39.9 % (ref 39.0–52.0)
Hemoglobin: 13.3 g/dL (ref 13.0–17.0)
MCH: 31.6 pg (ref 26.0–34.0)
MCHC: 33.3 g/dL (ref 30.0–36.0)
MCV: 94.8 fL (ref 80.0–100.0)
Platelets: 179 10*3/uL (ref 150–400)
RBC: 4.21 MIL/uL — ABNORMAL LOW (ref 4.22–5.81)
RDW: 11.9 % (ref 11.5–15.5)
WBC: 5.9 10*3/uL (ref 4.0–10.5)
nRBC: 0 % (ref 0.0–0.2)

## 2022-11-05 LAB — BASIC METABOLIC PANEL
Anion gap: 11 (ref 5–15)
BUN: 20 mg/dL (ref 8–23)
CO2: 27 mmol/L (ref 22–32)
Calcium: 9.4 mg/dL (ref 8.9–10.3)
Chloride: 104 mmol/L (ref 98–111)
Creatinine, Ser: 0.84 mg/dL (ref 0.61–1.24)
GFR, Estimated: >60 mL/min (ref >60)
Glucose, Bld: 121 mg/dL — ABNORMAL HIGH (ref 70–99)
Potassium: 3.9 mmol/L (ref 3.5–5.1)
Sodium: 142 mmol/L (ref 135–145)

## 2022-11-05 LAB — MAGNESIUM: Magnesium: 1.7 mg/dL (ref 1.7–2.4)

## 2022-11-05 MED ORDER — ASPIRIN 81 MG PO CHEW: 81.0000 mg | CHEWABLE_TABLET | Freq: Every day | ORAL | Status: AC

## 2022-11-05 MED ORDER — LOSARTAN POTASSIUM 25 MG PO TABS: 12.5000 mg | ORAL_TABLET | Freq: Every day | ORAL | 0 refills | Status: AC

## 2022-11-05 MED ORDER — METOPROLOL SUCCINATE ER 50 MG PO TB24
50.0000 mg | ORAL_TABLET | Freq: Every day | ORAL | Status: DC
  Administered 2022-11-05: 50 mg via ORAL
  Filled 2022-11-05: qty 1

## 2022-11-05 MED ORDER — ROSUVASTATIN CALCIUM 20 MG PO TABS: 20.0000 mg | ORAL_TABLET | Freq: Every day | ORAL | 0 refills | Status: AC

## 2022-11-05 MED ORDER — IODIXANOL 320 MG/ML IV SOLN
INTRAVENOUS | Status: DC | PRN
  Administered 2022-11-04: 220 mL

## 2022-11-05 MED ORDER — APIXABAN (ELIQUIS) VTE STARTER PACK (10MG AND 5MG): 5.0000 mg | ORAL_TABLET | Freq: Two times a day (BID) | ORAL | 0 refills | Status: AC

## 2022-11-05 MED ORDER — CLOPIDOGREL BISULFATE 75 MG PO TABS: 75.0000 mg | ORAL_TABLET | Freq: Every day | ORAL | 0 refills | Status: AC

## 2022-11-05 MED ORDER — LIDOCAINE HCL (PF) 1 % IJ SOLN
INTRAMUSCULAR | Status: DC | PRN
  Administered 2022-11-04: 10 mL

## 2022-11-05 NOTE — Discharge Summary (Signed)
Physician Discharge Summary   Patient: Hunter Carey MRN: [id number]  DOB: 11/21/1935   Admit:     Date of Admission: 11/03/2022 Admitted from: home   Discharge: Date of discharge: 11/05/22 Disposition: Home Condition at discharge: good  CODE STATUS: FULL CODE     Discharge Physician: Sunnie Nielsen, DO [facility]     PCP: Mick Sell, MD  Recommendations for Outpatient Follow-up:  Follow up with PCP Mick Sell, MD in 2-4 weeks Follow up as directed sooner w/ cardiology team  Please obtain labs/tests: CBC, BMP, BP check in 1-2 weeks may need to adjust medications based on this Please follow up on the following pending results: none PCP AND OTHER OUTPATIENT PROVIDERS: SEE BELOW FOR SPECIFIC DISCHARGE INSTRUCTIONS PRINTED FOR PATIENT IN ADDITION TO GENERIC AVS PATIENT INFO    Discharge Instructions     AMB Referral to Cardiac Rehabilitation - Phase II   Complete by: As directed    Diagnosis: NSTEMI   After initial evaluation and assessments completed: Virtual Based Care may be provided alone or in conjunction with Phase 2 Cardiac Rehab based on patient barriers.: Yes   Intensive Cardiac Rehabilitation (ICR) [facility] location only OR Traditional Cardiac Rehabilitation (TCR) *If criteria for ICR are not met will enroll in TCR [facility] only): Yes   Diet - low sodium heart healthy   Complete by: As directed    Increase activity slowly   Complete by: As directed          Discharge Diagnoses: Principal Problem:   NSTEMI (non-ST elevated myocardial infarction) [facility]) Active Problems:   Chest pain   Sick sinus syndrome (HCC)   Paroxysmal A-fib (HCC)   CAD S/P percutaneous coronary angioplasty       Hospital Course: [name] is an 87 year old male with history of sick sinus syndrome status post pacemaker placement, hypertension, hyperlipidemia, anticoagulation use with Eliquis, history of CAD status post STEMI requiring stent  placement to the LAD, history of sinus bradycardia, history of TIA, who presents to the ED for chief concerns of heart palpitation. 08/21: in ED, (+)aflutter, placed on dilt drip. Troponin 5072, started heparin. Cardiology consulted.  08/22: LHC w/ stent, converted to NSR, monitor overnight  08/23: doing well off amiodarone gtt, stable for discharge per cardiology     Consultants:  Cardiology   Procedures: 11/04/22 left cardiac cath and stent placement       ASSESSMENT & PLAN:    NSTEMI (non-ST elevated myocardial infarction) [facility]) Chest pain CAD S/P percutaneous coronary angioplasty 2001 S/p PCI this admission --> stent x2 to RCA ASA x2 weeks Plavix Eliquis  ACE changed to ARB Changes statin to higher potency  Continue beta blocker  Holding hydrochlorothiazide pending cardiology f/u outpatient Holding isosorbide pending cardiology f/u outpatient  Paroxysmal A-fib [facility]), rapid response  Was on dilt gtt --> converted to NSR in cath procedure  Home Eliquis resumed  Sick sinus syndrome (HCC) Status post Medtronic dual-chamber pacemaker placement on 08/31/2022 Interrogate pacer per cardiology              Discharge Instructions  Allergies as of 11/05/2022   No Known Allergies      Medication List     STOP taking these medications    hydrochlorothiazide 12.5 MG capsule Commonly known as: MICROZIDE   isosorbide mononitrate 60 MG 24 hr tablet Commonly known as: IMDUR   meclizine 25 MG tablet Commonly known as: ANTIVERT   ramipril 10 MG capsule Commonly  known as: ALTACE   simvastatin 10 MG tablet Commonly known as: ZOCOR       TAKE these medications    Apixaban Starter Pack (10mg  and 5mg ) Commonly known as: ELIQUIS STARTER PACK Take 5 mg by mouth in the morning and at bedtime. What changed:  how much to take how to take this when to take this additional instructions   aspirin 81 MG chewable tablet Chew 1 tablet (81 mg total) by mouth  daily for 14 days. Then STOP this Start taking on: November 06, 2022   calcium carbonate 750 MG chewable tablet Commonly known as: TUMS EX Chew 1 tablet by mouth daily as needed for heartburn.   clopidogrel 75 MG tablet Commonly known as: PLAVIX Take 1 tablet (75 mg total) by mouth daily.   losartan 25 MG tablet Commonly known as: COZAAR Take 0.5 tablets (12.5 mg total) by mouth at bedtime.   metoprolol succinate 50 MG 24 hr tablet Commonly known as: Toprol XL Take 1 tablet (50 mg total) by mouth daily. Take with or immediately following a meal.   PreserVision AREDS 2 Caps Take 1 capsule by mouth daily.   rosuvastatin 20 MG tablet Commonly known as: CRESTOR Take 1 tablet (20 mg total) by mouth daily. Start taking on: November 06, 2022         Follow-up Information     Paraschos, Lyn Hollingshead, MD. Go in 1 week(s).   Specialty: Cardiology Why: Appointment on Friday, 11/12/2022 at 10:30am with Marijo Conception. Contact information: [address] [facility] [place] [address] [phone]                 No Known Allergies   Subjective: pt reports doing well this morning, no chest pain, he is coughing some which he attriubuts to sinus drainage    Discharge Exam: BP 121/67 (BP Location: Left Arm)   Pulse 72   Temp 98 F (36.7 C)   Resp 18   Ht 5\' 8"  (1.727 m)   Wt 62.6 kg   SpO2 100%   BMI 20.98 kg/m  General: Pt is alert, awake, not in acute distress Cardiovascular: RRR, S1/S2, no rubs, no gallops Respiratory: CTA bilaterally, no wheezing, no rhonchi Abdominal: Soft, NT, ND, bowel sounds + Extremities: no edema, no cyanosis     The results of significant diagnostics from this hospitalization (including imaging, microbiology, ancillary and laboratory) are listed below for reference.     Microbiology: No results found for this or any previous visit (from the past 240 hour(s)).   Labs: BNP (last 3 results) No results  for input(s): "BNP" in the last 8760 hours. Basic Metabolic Panel: Recent Labs  Lab 11/03/22 1546 11/04/22 0445 11/05/22 0208  NA 137 138 142  K 3.2* 3.2* 3.9  CL 99 96* 104  CO2 30 29 27   GLUCOSE 94 100* 121*  BUN 20 22 20   CREATININE 0.92 0.91 0.84  CALCIUM 9.9 9.8 9.4  MG  --   --  1.7   Liver Function Tests: No results for input(s): "AST", "ALT", "ALKPHOS", "BILITOT", "PROT", "ALBUMIN" in the last 168 hours. No results for input(s): "LIPASE", "AMYLASE" in the last 168 hours. No results for input(s): "AMMONIA" in the last 168 hours. CBC: Recent Labs  Lab 11/03/22 1546 11/04/22 0445 11/05/22 0208  WBC 5.6 5.1 5.9  HGB 16.6 15.5 13.3  HCT 50.4 46.3 39.9  MCV 95.8 94.3 94.8  PLT 181 167 179   Cardiac Enzymes: No results  for input(s): "CKTOTAL", "CKMB", "CKMBINDEX", "TROPONINI" in the last 168 hours. BNP: Invalid input(s): "POCBNP" CBG: No results for input(s): "GLUCAP" in the last 168 hours. D-Dimer No results for input(s): "DDIMER" in the last 72 hours. Hgb A1c No results for input(s): "HGBA1C" in the last 72 hours. Lipid Profile No results for input(s): "CHOL", "HDL", "LDLCALC", "TRIG", "CHOLHDL", "LDLDIRECT" in the last 72 hours. Thyroid function studies No results for input(s): "TSH", "T4TOTAL", "T3FREE", "THYROIDAB" in the last 72 hours.  Invalid input(s): "FREET3" Anemia work up No results for input(s): "VITAMINB12", "FOLATE", "FERRITIN", "TIBC", "IRON", "RETICCTPCT" in the last 72 hours. Urinalysis    Component Value Date/Time   COLORURINE YELLOW (A) 02/23/2016 1100   APPEARANCEUR CLEAR (A) 02/23/2016 1100   LABSPEC 1.024 02/23/2016 1100   PHURINE 5.0 02/23/2016 1100   GLUCOSEU NEGATIVE 02/23/2016 1100   HGBUR SMALL (A) 02/23/2016 1100   BILIRUBINUR NEGATIVE 02/23/2016 1100   KETONESUR NEGATIVE 02/23/2016 1100   PROTEINUR NEGATIVE 02/23/2016 1100   NITRITE NEGATIVE 02/23/2016 1100   LEUKOCYTESUR NEGATIVE 02/23/2016 1100   Sepsis Labs Recent  Labs  Lab 11/03/22 1546 11/04/22 0445 11/05/22 0208  WBC 5.6 5.1 5.9   Microbiology No results found for this or any previous visit (from the past 240 hour(s)). Imaging CARDIAC CATHETERIZATION  Result Date: 11/04/2022   Prox Cx to Mid Cx lesion is 30% stenosed.   Prox RCA lesion is 30% stenosed.   Mid RCA lesion is 95% stenosed.   Ost 1st Mrg lesion is 50% stenosed.   Ost LAD to Prox LAD lesion is 20% stenosed.   Prox LAD to Mid LAD lesion is 30% stenosed.   Ost RCA lesion is 85% stenosed.   A drug-eluting stent was successfully placed using a STENT ONYX FRONTIER 3.0X26.   A drug-eluting stent was successfully placed using a STENT ONYX FRONTIER 3.5X15.   Post intervention, there is a 0% residual stenosis.   Post intervention, there is a 0% residual stenosis.   The left ventricular systolic function is normal.   The left ventricular ejection fraction is 50-55% by visual estimate. 1.  NSTEMI 2.  Patent stent proximal LAD 3.  85% stenosis ostial RCA, and 5% stenosis mid RCA 4.  Normal left ventricular function 5.  Successful PCI with 3.0 x 26 mm Onyx frontier DES mid RCA, and 3.5 x 15 mm Onyx frontier DES ostial RCA Recommendations 1.  Dual antiplatelet therapy x 2 weeks then DC aspirin 2.  Resume Eliquis 5 mg twice daily for stroke prevention 3.  Aggressive risk factor modification 4.  DC amiodarone infusion after completion of 1 g load   ECHOCARDIOGRAM COMPLETE  Result Date: 11/04/2022    ECHOCARDIOGRAM REPORT   Patient Name:   Hunter Carey Date of Exam: 11/04/2022 Medical Rec #:  [id number]      Height:       68.0 in Accession #:    [id number]     Weight:       135.7 lb Date of Birth:  [birth year]     BSA:          1.733 m Patient Age:    86 years       BP:           112/66 mmHg Patient Gender: M              HR:           78 bpm. Exam Location:  [facility] Procedure: 2D Echo, Cardiac Doppler and Color  Doppler Indications:     Chest pain R07.9                  Elevated troponin  History:         Patient  has no prior history of Echocardiogram examinations.                  Previous Myocardial Infarction, COPD, Signs/Symptoms:Murmur;                  Risk Factors:Hypertension.  Sonographer:     Cristela Blue Referring Phys:  [id number] AMY N COX Diagnosing Phys: Rozell Searing Custovic  Sonographer Comments: Suboptimal parasternal window. IMPRESSIONS  1. Left ventricular ejection fraction, by estimation, is 55 to 60%. The left ventricle has normal function. The left ventricle has no regional wall motion abnormalities. Left ventricular diastolic parameters were normal.  2. Right ventricular systolic function is normal. The right ventricular size is normal.  3. The mitral valve is grossly normal. Trivial mitral valve regurgitation.  4. The aortic valve is grossly normal. Aortic valve regurgitation is not visualized. Aortic valve sclerosis/calcification is present, without any evidence of aortic stenosis. FINDINGS  Left Ventricle: Left ventricular ejection fraction, by estimation, is 55 to 60%. The left ventricle has normal function. The left ventricle has no regional wall motion abnormalities. The left ventricular internal cavity size was normal in size. There is  no left ventricular hypertrophy. Left ventricular diastolic parameters were normal. Right Ventricle: The right ventricular size is normal. No increase in right ventricular wall thickness. Right ventricular systolic function is normal. Left Atrium: Left atrial size was normal in size. Right Atrium: Right atrial size was normal in size. Pericardium: There is no evidence of pericardial effusion. Mitral Valve: The mitral valve is grossly normal. Trivial mitral valve regurgitation. MV peak gradient, 6.0 mmHg. The mean mitral valve gradient is 2.0 mmHg. Tricuspid Valve: The tricuspid valve is grossly normal. Tricuspid valve regurgitation is trivial. Aortic Valve: The aortic valve is grossly normal. Aortic valve regurgitation is not visualized. Aortic valve  sclerosis/calcification is present, without any evidence of aortic stenosis. Aortic valve mean gradient measures 2.0 mmHg. Aortic valve peak gradient measures 3.6 mmHg. Aortic valve area, by VTI measures 3.97 cm. Pulmonic Valve: The pulmonic valve was grossly normal. Pulmonic valve regurgitation is not visualized. Aorta: The aortic root is normal in size and structure. IAS/Shunts: No atrial level shunt detected by color flow Doppler. Additional Comments: A device lead is visualized.  LEFT VENTRICLE PLAX 2D LVIDd:         3.80 cm   Diastology LVIDs:         2.20 cm   LV e' medial:   9.03 cm/s LV PW:         1.00 cm   LV E/e' medial: 10.7 LV IVS:        1.00 cm LVOT diam:     2.00 cm LV SV:         57 LV SV Index:   33 LVOT Area:     3.14 cm  RIGHT VENTRICLE RV Basal diam:  4.30 cm RV Mid diam:    3.30 cm LEFT ATRIUM             Index        RIGHT ATRIUM           Index LA diam:        3.30 cm 1.90 cm/m   RA Area:     18.20 cm  LA Vol (A2C):   16.9 ml 9.75 ml/m   RA Volume:   53.20 ml  30.70 ml/m LA Vol (A4C):   23.3 ml 13.44 ml/m LA Biplane Vol: 20.8 ml 12.00 ml/m  AORTIC VALVE AV Area (Vmax):    3.58 cm AV Area (Vmean):   4.24 cm AV Area (VTI):     3.97 cm AV Vmax:           94.90 cm/s AV Vmean:          60.900 cm/s AV VTI:            0.144 m AV Peak Grad:      3.6 mmHg AV Mean Grad:      2.0 mmHg LVOT Vmax:         108.00 cm/s LVOT Vmean:        82.200 cm/s LVOT VTI:          0.182 m LVOT/AV VTI ratio: 1.26  AORTA Ao Root diam: 2.10 cm MITRAL VALVE               TRICUSPID VALVE MV Area (PHT): 5.23 cm    TR Peak grad:   24.8 mmHg MV Area VTI:   3.19 cm    TR Vmax:        249.00 cm/s MV Peak grad:  6.0 mmHg MV Mean grad:  2.0 mmHg    SHUNTS MV Vmax:       1.22 m/s    Systemic VTI:  0.18 m MV Vmean:      66.2 cm/s   Systemic Diam: 2.00 cm MV Decel Time: 145 msec MV E velocity: 96.90 cm/s MV A velocity: 56.10 cm/s MV E/A ratio:  1.73 Designer, multimedia signed by Clotilde Dieter Signature  Date/Time: 11/04/2022/11:54:50 AM    Final       Time coordinating discharge: over 30 minutes  SIGNED:  Sunnie Nielsen DO [facility]

## 2022-11-05 NOTE — Plan of Care (Signed)
  Problem: Cardiovascular: Goal: Ability to achieve and maintain adequate cardiovascular perfusion will improve 11/05/2022 0649 by Julieanne Manson, RN Outcome: Progressing 11/05/2022 0649 by Julieanne Manson, RN Outcome: Progressing Goal: Vascular access site(s) Level 0-1 will be maintained 11/05/2022 0649 by Julieanne Manson, RN Outcome: Progressing 11/05/2022 0649 by Julieanne Manson, RN Outcome: Progressing   Problem: Clinical Measurements: Goal: Ability to maintain clinical measurements within normal limits will improve 11/05/2022 0649 by Julieanne Manson, RN Outcome: Progressing 11/05/2022 0649 by Julieanne Manson, RN Outcome: Progressing Goal: Will remain free from infection 11/05/2022 0649 by Julieanne Manson, RN Outcome: Progressing 11/05/2022 0649 by Julieanne Manson, RN Outcome: Progressing Goal: Diagnostic test results will improve 11/05/2022 0649 by Julieanne Manson, RN Outcome: Progressing 11/05/2022 0649 by Julieanne Manson, RN Outcome: Progressing Goal: Respiratory complications will improve 11/05/2022 0649 by Julieanne Manson, RN Outcome: Progressing 11/05/2022 0649 by Julieanne Manson, RN Outcome: Progressing Goal: Cardiovascular complication will be avoided 11/05/2022 0649 by Julieanne Manson, RN Outcome: Progressing 11/05/2022 0649 by Julieanne Manson, RN Outcome: Progressing   Problem: Coping: Goal: Level of anxiety will decrease 11/05/2022 0649 by Julieanne Manson, RN Outcome: Progressing 11/05/2022 0649 by Julieanne Manson, RN Outcome: Progressing   Problem: Safety: Goal: Ability to remain free from injury will improve 11/05/2022 0649 by Julieanne Manson, RN Outcome: Progressing 11/05/2022 0649 by Julieanne Manson, RN Outcome: Progressing

## 2022-11-05 NOTE — Care Management Important Message (Signed)
Important Message  Patient Details  Name: Hunter Carey MRN: [id number] Date of Birth: July 06, 1935   Medicare Important Message Given:  Yes     Johnell Comings 11/05/2022, 11:31 AM

## 2022-11-05 NOTE — Progress Notes (Signed)
[facility] CLINIC CARDIOLOGY CONSULT NOTE       Patient ID: Hunter Carey MRN: [id number] DOB/AGE: [birth year] 87 y.o.  Admit date: 11/03/2022 Referring Physician Dr. Londell Moh  Primary Physician Dr. Sampson Goon Primary Cardiologist Dr. Darrold Junker Reason for Consultation atrial flutter RVR, NSTEMI  HPI: Hunter Carey is a 87 y.o. male  with a past medical history of coronary artery disease s/p STEMI 2001 with PCI and DES to proximal LAD with residual 50% mid LAD, 50% ostial RCA, 50% mid RCA lesions, paroxysmal atrial fibrillation, sick sinus syndrome s/p permanent pacemaker 08/31/2022, sinus bradycardia, hypertension, hyperlipidemia, history of TIA who presented to the ED on 11/03/2022 for palpitations and fast heart rate.  Cardiology was consulted for further evaluation of atrial flutter with RVR, NSTEMI.   Interval History:  -Patient is feeling well this morning, denies any chest pain, shortness of breath, palpitations. -Remains in sinus rhythm on telemetry with rates in the 70s. -Renal function stable this AM and potassium improved after supplementation.  Review of systems complete and found to be negative unless listed above     Past Medical History:  Diagnosis Date   CAD (coronary artery disease)    Compression fracture h/o   COPD (chronic obstructive pulmonary disease) (HCC)    Edema    VERY MILD OF CALF OCCAS   Heart murmur    Hyperlipemia    Hypertension    MI, old 2001   lad stent   Shortness of breath dyspnea    DOE    Past Surgical History:  Procedure Laterality Date   CATARACT EXTRACTION W/PHACO Left 03/24/2015   Procedure: CATARACT EXTRACTION PHACO AND INTRAOCULAR LENS PLACEMENT (IOC);  Surgeon: Sallee Lange, MD;  Location: [facility] ORS;  Service: Ophthalmology;  Laterality: Left;  [place] 01:06 AP% 21.7 CDE 29.12 fluid pack lot # [id number]   CATARACT EXTRACTION W/PHACO Right 02/21/2018   Procedure: CATARACT EXTRACTION PHACO AND INTRAOCULAR LENS PLACEMENT (IOC) RIGHT;   Surgeon: Nevada Crane, MD;  Location: [facility];  Service: Ophthalmology;  Laterality: Right;   COLONOSCOPY WITH PROPOFOL N/A 08/25/2015   Procedure: COLONOSCOPY WITH PROPOFOL;  Surgeon: Scot Jun, MD;  Location: [facility] ENDOSCOPY;  Service: Endoscopy;  Laterality: N/A;   CORONARY ANGIOPLASTY WITH STENT PLACEMENT     CORONARY PRESSURE/FFR STUDY N/A 10/30/2020   Procedure: INTRAVASCULAR PRESSURE WIRE/FFR STUDY;  Surgeon: Marcina Millard, MD;  Location: [facility] INVASIVE CV LAB;  Service: Cardiovascular;  Laterality: N/A;  RCA   CORONARY STENT INTERVENTION N/A 11/04/2022   Procedure: CORONARY STENT INTERVENTION;  Surgeon: Marcina Millard, MD;  Location: [facility] INVASIVE CV LAB;  Service: Cardiovascular;  Laterality: N/A;   HERNIA REPAIR     LEFT HEART CATH AND CORONARY ANGIOGRAPHY Left 08/15/2018   Procedure: LEFT HEART CATH AND CORONARY ANGIOGRAPHY;  Surgeon: Marcina Millard, MD;  Location: [facility] INVASIVE CV LAB;  Service: Cardiovascular;  Laterality: Left;   LEFT HEART CATH AND CORONARY ANGIOGRAPHY N/A 10/30/2020   Procedure: LEFT HEART CATH AND CORONARY ANGIOGRAPHY;  Surgeon: Marcina Millard, MD;  Location: [facility] INVASIVE CV LAB;  Service: Cardiovascular;  Laterality: N/A;   LEFT HEART CATH AND CORONARY ANGIOGRAPHY N/A 11/04/2022   Procedure: LEFT HEART CATH AND CORONARY ANGIOGRAPHY;  Surgeon: Marcina Millard, MD;  Location: [facility] INVASIVE CV LAB;  Service: Cardiovascular;  Laterality: N/A;   PACEMAKER IMPLANT N/A 08/31/2022   Procedure: PACEMAKER IMPLANT;  Surgeon: Marcina Millard, MD;  Location: [facility] INVASIVE CV LAB;  Service: Cardiovascular;  Laterality: N/A;   VASECTOMY  Medications Prior to Admission  Medication Sig Dispense Refill Last Dose   APIXABAN (ELIQUIS) VTE STARTER PACK (10MG  AND 5MG ) Take as directed on package: start with two-5mg  tablets twice daily for 7 days. On day 8, switch to one-5mg  tablet twice daily. 1 each 0 11/03/2022 at 0800    hydrochlorothiazide (MICROZIDE) 12.5 MG capsule Take 12.5 mg by mouth daily.   11/03/2022   isosorbide mononitrate (IMDUR) 60 MG 24 hr tablet Take 60 mg by mouth daily.   11/03/2022   metoprolol succinate (TOPROL XL) 50 MG 24 hr tablet Take 1 tablet (50 mg total) by mouth daily. Take with or immediately following a meal. 30 tablet 1 11/03/2022   Multiple Vitamins-Minerals (PRESERVISION AREDS 2) CAPS Take 1 capsule by mouth daily.   11/03/2022   ramipril (ALTACE) 10 MG capsule Take 10 mg by mouth 2 (two) times daily.    11/03/2022   simvastatin (ZOCOR) 10 MG tablet Take 5 mg by mouth at bedtime.    11/03/2022   calcium carbonate (TUMS EX) 750 MG chewable tablet Chew 1 tablet by mouth daily as needed for heartburn. (Patient not taking: Reported on 11/03/2022)   Not Taking   meclizine (ANTIVERT) 25 MG tablet Take 1 tablet by mouth 3 (three) times daily as needed. (Patient not taking: Reported on 11/03/2022)   Not Taking   Social History   Socioeconomic History   Marital status: Married    Spouse name: Not on file   Number of children: Not on file   Years of education: Not on file   Highest education level: Not on file  Occupational History   Not on file  Tobacco Use   Smoking status: Former   Smokeless tobacco: Never  Substance and Sexual Activity   Alcohol use: Yes    Alcohol/week: 7.0 standard drinks of alcohol    Types: 7 Glasses of wine per week   Drug use: No   Sexual activity: Yes  Other Topics Concern   Not on file  Social History Narrative   Not on file   Social Determinants of Health   Financial Resource Strain: Low Risk  (09/23/2020)   Received from [facility]   Overall Financial Resource Strain (CARDIA)    Difficulty of Paying Living Expenses: Not hard at all  Food Insecurity: No Food Insecurity (11/04/2022)   Hunger Vital Sign    Worried About Running Out of Food in the Last Year: Never true    Ran Out of Food in the Last Year: Never true   Transportation Needs: No Transportation Needs (11/04/2022)   PRAPARE - Administrator, Civil Service (Medical): No    Lack of Transportation (Non-Medical): No  Physical Activity: Not on file  Stress: Not on file  Social Connections: Not on file  Intimate Partner Violence: Not At Risk (11/04/2022)   Humiliation, Afraid, Rape, and Kick questionnaire    Fear of Current or Ex-Partner: No    Emotionally Abused: No    Physically Abused: No    Sexually Abused: No    Family History  Problem Relation Age of Onset   Hypertension Mother      Vitals:   11/04/22 2131 11/05/22 0014 11/05/22 0423 11/05/22 0817  BP: 131/85 (!) 142/74 122/67 124/74  Pulse: 77 80 74 81  Resp:  20 18 18   Temp:  98.4 F (36.9 C) 97.9 F (36.6 C) 98 F (36.7 C)  TempSrc:      SpO2:  98% 100% 96%  Weight:      Height:        PHYSICAL EXAM General: well-appearing elderly male, well nourished, in no acute distress sitting upright in hospital bed. HEENT: Normocephalic and atraumatic. Neck: No JVD.  Lungs: Normal respiratory effort on room air. Clear bilaterally to auscultation. No wheezes, crackles, rhonchi.  Heart: HRRR. Normal S1 and S2 without gallops or murmurs.  Abdomen: Non-distended appearing.  Msk: Normal strength and tone for age. Extremities: Warm and well perfused. No clubbing, cyanosis. No edema.  Neuro: Alert and oriented X 3. Psych: Answers questions appropriately.   Labs: Basic Metabolic Panel: Recent Labs    11/04/22 0445 11/05/22 0208  NA 138 142  K 3.2* 3.9  CL 96* 104  CO2 29 27  GLUCOSE 100* 121*  BUN 22 20  CREATININE 0.91 0.84  CALCIUM 9.8 9.4  MG  --  1.7   Liver Function Tests: No results for input(s): "AST", "ALT", "ALKPHOS", "BILITOT", "PROT", "ALBUMIN" in the last 72 hours. No results for input(s): "LIPASE", "AMYLASE" in the last 72 hours. CBC: Recent Labs    11/04/22 0445 11/05/22 0208  WBC 5.1 5.9  HGB 15.5 13.3  HCT 46.3 39.9  MCV 94.3 94.8   PLT 167 179   Cardiac Enzymes: Recent Labs    11/03/22 1546 11/03/22 1733  TROPONINIHS 5,072* 4,809*   BNP: No results for input(s): "BNP" in the last 72 hours. D-Dimer: No results for input(s): "DDIMER" in the last 72 hours. Hemoglobin A1C: No results for input(s): "HGBA1C" in the last 72 hours. Fasting Lipid Panel: No results for input(s): "CHOL", "HDL", "LDLCALC", "TRIG", "CHOLHDL", "LDLDIRECT" in the last 72 hours. Thyroid Function Tests: No results for input(s): "TSH", "T4TOTAL", "T3FREE", "THYROIDAB" in the last 72 hours.  Invalid input(s): "FREET3" Anemia Panel: No results for input(s): "VITAMINB12", "FOLATE", "FERRITIN", "TIBC", "IRON", "RETICCTPCT" in the last 72 hours.   Radiology: CARDIAC CATHETERIZATION  Result Date: 11/04/2022   Prox Cx to Mid Cx lesion is 30% stenosed.   Prox RCA lesion is 30% stenosed.   Mid RCA lesion is 95% stenosed.   Ost 1st Mrg lesion is 50% stenosed.   Ost LAD to Prox LAD lesion is 20% stenosed.   Prox LAD to Mid LAD lesion is 30% stenosed.   Ost RCA lesion is 85% stenosed.   A drug-eluting stent was successfully placed using a STENT ONYX FRONTIER 3.0X26.   A drug-eluting stent was successfully placed using a STENT ONYX FRONTIER 3.5X15.   Post intervention, there is a 0% residual stenosis.   Post intervention, there is a 0% residual stenosis.   The left ventricular systolic function is normal.   The left ventricular ejection fraction is 50-55% by visual estimate. 1.  NSTEMI 2.  Patent stent proximal LAD 3.  85% stenosis ostial RCA, and 5% stenosis mid RCA 4.  Normal left ventricular function 5.  Successful PCI with 3.0 x 26 mm Onyx frontier DES mid RCA, and 3.5 x 15 mm Onyx frontier DES ostial RCA Recommendations 1.  Dual antiplatelet therapy x 2 weeks then DC aspirin 2.  Resume Eliquis 5 mg twice daily for stroke prevention 3.  Aggressive risk factor modification 4.  DC amiodarone infusion after completion of 1 g load   ECHOCARDIOGRAM  COMPLETE  Result Date: 11/04/2022    ECHOCARDIOGRAM REPORT   Patient Name:   Hunter Carey Schwebach Date of Exam: 11/04/2022 Medical Rec #:  [id number]      Height:  68.0 in Accession #:    [id number]     Weight:       135.7 lb Date of Birth:  [birth year]     BSA:          1.733 m Patient Age:    86 years       BP:           112/66 mmHg Patient Gender: M              HR:           78 bpm. Exam Location:  [facility] Procedure: 2D Echo, Cardiac Doppler and Color Doppler Indications:     Chest pain R07.9                  Elevated troponin  History:         Patient has no prior history of Echocardiogram examinations.                  Previous Myocardial Infarction, COPD, Signs/Symptoms:Murmur;                  Risk Factors:Hypertension.  Sonographer:     Cristela Blue Referring Phys:  [id number] AMY N COX Diagnosing Phys: Rozell Searing Custovic  Sonographer Comments: Suboptimal parasternal window. IMPRESSIONS  1. Left ventricular ejection fraction, by estimation, is 55 to 60%. The left ventricle has normal function. The left ventricle has no regional wall motion abnormalities. Left ventricular diastolic parameters were normal.  2. Right ventricular systolic function is normal. The right ventricular size is normal.  3. The mitral valve is grossly normal. Trivial mitral valve regurgitation.  4. The aortic valve is grossly normal. Aortic valve regurgitation is not visualized. Aortic valve sclerosis/calcification is present, without any evidence of aortic stenosis. FINDINGS  Left Ventricle: Left ventricular ejection fraction, by estimation, is 55 to 60%. The left ventricle has normal function. The left ventricle has no regional wall motion abnormalities. The left ventricular internal cavity size was normal in size. There is  no left ventricular hypertrophy. Left ventricular diastolic parameters were normal. Right Ventricle: The right ventricular size is normal. No increase in right ventricular wall thickness. Right ventricular systolic function  is normal. Left Atrium: Left atrial size was normal in size. Right Atrium: Right atrial size was normal in size. Pericardium: There is no evidence of pericardial effusion. Mitral Valve: The mitral valve is grossly normal. Trivial mitral valve regurgitation. MV peak gradient, 6.0 mmHg. The mean mitral valve gradient is 2.0 mmHg. Tricuspid Valve: The tricuspid valve is grossly normal. Tricuspid valve regurgitation is trivial. Aortic Valve: The aortic valve is grossly normal. Aortic valve regurgitation is not visualized. Aortic valve sclerosis/calcification is present, without any evidence of aortic stenosis. Aortic valve mean gradient measures 2.0 mmHg. Aortic valve peak gradient measures 3.6 mmHg. Aortic valve area, by VTI measures 3.97 cm. Pulmonic Valve: The pulmonic valve was grossly normal. Pulmonic valve regurgitation is not visualized. Aorta: The aortic root is normal in size and structure. IAS/Shunts: No atrial level shunt detected by color flow Doppler. Additional Comments: A device lead is visualized.  LEFT VENTRICLE PLAX 2D LVIDd:         3.80 cm   Diastology LVIDs:         2.20 cm   LV e' medial:   9.03 cm/s LV PW:         1.00 cm   LV E/e' medial: 10.7 LV IVS:        1.00 cm LVOT  diam:     2.00 cm LV SV:         57 LV SV Index:   33 LVOT Area:     3.14 cm  RIGHT VENTRICLE RV Basal diam:  4.30 cm RV Mid diam:    3.30 cm LEFT ATRIUM             Index        RIGHT ATRIUM           Index LA diam:        3.30 cm 1.90 cm/m   RA Area:     18.20 cm LA Vol (A2C):   16.9 ml 9.75 ml/m   RA Volume:   53.20 ml  30.70 ml/m LA Vol (A4C):   23.3 ml 13.44 ml/m LA Biplane Vol: 20.8 ml 12.00 ml/m  AORTIC VALVE AV Area (Vmax):    3.58 cm AV Area (Vmean):   4.24 cm AV Area (VTI):     3.97 cm AV Vmax:           94.90 cm/s AV Vmean:          60.900 cm/s AV VTI:            0.144 m AV Peak Grad:      3.6 mmHg AV Mean Grad:      2.0 mmHg LVOT Vmax:         108.00 cm/s LVOT Vmean:        82.200 cm/s LVOT VTI:           0.182 m LVOT/AV VTI ratio: 1.26  AORTA Ao Root diam: 2.10 cm MITRAL VALVE               TRICUSPID VALVE MV Area (PHT): 5.23 cm    TR Peak grad:   24.8 mmHg MV Area VTI:   3.19 cm    TR Vmax:        249.00 cm/s MV Peak grad:  6.0 mmHg MV Mean grad:  2.0 mmHg    SHUNTS MV Vmax:       1.22 m/s    Systemic VTI:  0.18 m MV Vmean:      66.2 cm/s   Systemic Diam: 2.00 cm MV Decel Time: 145 msec MV E velocity: 96.90 cm/s MV A velocity: 56.10 cm/s MV E/A ratio:  1.73 Designer, multimedia signed by Clotilde Dieter Signature Date/Time: 11/04/2022/11:54:50 AM    Final    DG Chest 2 View  Result Date: 11/03/2022 CLINICAL DATA:  CP.  Tachycardia. EXAM: CHEST - 2 VIEW COMPARISON:  08/31/2022. FINDINGS: Bilateral lungs appear hyperexpanded and hyperlucent with coarse bronchovascular markings, concerning for COPD. Bilateral lungs otherwise appear clear. No dense consolidation. Bilateral costophrenic angles are clear. Normal cardio-mediastinal silhouette. There is a left sided 2-lead pacemaker. No acute osseous abnormalities. The soft tissues are within normal limits. IMPRESSION: 1. No active cardiopulmonary disease. 2. Probable COPD. Electronically Signed   By: Jules Schick M.D.   On: 11/03/2022 17:02    ECHO results above  TELEMETRY reviewed by me [facility]) 11/05/2022: sinus rhythm rate 70s  EKG reviewed by me: atrial flutter RVR rate 151 bpm  Data reviewed by me [facility]) 11/05/2022: last 24h vitals tele labs imaging I/O ED provider note, admission H&P note  Principal Problem:   NSTEMI (non-ST elevated myocardial infarction) [facility]) Active Problems:   Chest pain   Sick sinus syndrome (HCC)   Paroxysmal A-fib (HCC)   CAD S/P percutaneous coronary angioplasty  ASSESSMENT AND PLAN:  Hunter Carey is a 87 y.o. male  with a past medical history of coronary artery disease s/p STEMI 2001 with PCI and DES to proximal LAD with residual 50% mid LAD, 50% ostial RCA, 50% mid RCA lesions, paroxysmal atrial  fibrillation, sick sinus syndrome s/p permanent pacemaker 08/31/2022, sinus bradycardia, hypertension, hyperlipidemia, history of TIA who presented to the ED on 11/03/2022 for palpitations and fast heart rate.  Cardiology was consulted for further evaluation of atrial flutter with RVR, NSTEMI.   # Atrial flutter RVR # Paroxysmal atrial fibrillation # Sick sinus syndrome s/p PPM 08/31/2022 Rate on presentation to the ED in the 150s, patient initiated on diltizem infusion and rate improved to 80-90s this morning on telemetry prior to his cath yesterday.  He converted to normal sinus rhythm during LHC and remains in sinus this a.m.  Denies any recurrence of palpitations -Amiodarone infusion was continued overnight.  Will DC this a.m. -Echo with EF 55-60%, no wall motion abnormalities, normal diastolic function -Will restart home metoprolol this a.m. -Restart home Eliquis 5 mg twice daily.  # Chest pain # NSTEMI # CAD s/p STEMI 2001 with PCI to prox LAD With episodes of chest pain over the last 3 days, troponin elevated in the ED to 5072 yesterday and downtrending to 4809.  EKG with rapid atrial flutter.  LHC 11/03/2020 with PCI and DES to ostial RCA (ONYX FRONTIER 3.5X15) and DES to mid RCA (ONYX FRONTIER 3.0X26).  Chest pain-free this a.m. -Triple therapy with aspirin, Plavix, Eliquis for 2 weeks then patient will discontinue aspirin and continue Plavix and Eliquis. -Change home simvastatin to Crestor 20 mg daily.  # Hypertension # Hyperlipidemia Blood pressure has been controlled since admission.  Home meds are currently being held.  Lipid panel from February with TC 135, LDL 59, HDL 64, triglycerides 59.  -Change home ramipril to losartan 12.5 mg daily. -Crestor 20 mg daily  # Hypokalemia Potassium noted to be 3.2 on admission yesterday and again yesterday.  Improved with supplementation, now at 3.9 today. -Monitor and replenish electrolytes for a goal K >4, Mag >2   Ok for discharge today  from a cardiac perspective. Will arrange for follow up in clinic with Dr. Darrold Junker in 1-2 weeks.   This patient's plan of care was discussed and created with Dr. Melton Alar and she is in agreement.  Signed: Cheryl Flash, PA-C 11/05/2022, 8:54 AM [facility]

## 2022-11-06 LAB — LIPOPROTEIN A (LPA): Lipoprotein (a): 13.9 nmol/L (ref <75.0)

## 2022-11-12 ENCOUNTER — Other Ambulatory Visit
Admission: RE | Admit: 2022-11-12 | Discharge: 2022-11-12 | Disposition: A | Discharge status: Home / Self Care | Payer: Medicare Other | Source: Ambulatory Visit | Attending: Nurse Practitioner | Admitting: Nurse Practitioner

## 2022-11-12 DIAGNOSIS — I2510 Atherosclerotic heart disease of native coronary artery without angina pectoris: Secondary | ICD-10-CM | POA: Diagnosis present

## 2022-11-12 DIAGNOSIS — I480 Paroxysmal atrial fibrillation: Secondary | ICD-10-CM | POA: Diagnosis present

## 2022-11-12 DIAGNOSIS — Z9889 Other specified postprocedural states: Secondary | ICD-10-CM | POA: Insufficient documentation

## 2022-11-12 DIAGNOSIS — I252 Old myocardial infarction: Secondary | ICD-10-CM | POA: Diagnosis not present

## 2022-11-12 DIAGNOSIS — R6881 Early satiety: Secondary | ICD-10-CM | POA: Insufficient documentation

## 2022-11-12 DIAGNOSIS — Z950 Presence of cardiac pacemaker: Secondary | ICD-10-CM | POA: Diagnosis not present

## 2022-11-12 DIAGNOSIS — Z955 Presence of coronary angioplasty implant and graft: Secondary | ICD-10-CM | POA: Diagnosis not present

## 2022-11-12 LAB — BRAIN NATRIURETIC PEPTIDE: B Natriuretic Peptide: 272.7 pg/mL — ABNORMAL HIGH (ref 0.0–100.0)

## 2022-11-29 ENCOUNTER — Other Ambulatory Visit
Admission: RE | Admit: 2022-11-29 | Discharge: 2022-11-29 | Disposition: A | Discharge status: Home / Self Care | Payer: Medicare Other | Source: Ambulatory Visit | Attending: Nurse Practitioner | Admitting: Nurse Practitioner

## 2022-11-29 DIAGNOSIS — I10 Essential (primary) hypertension: Secondary | ICD-10-CM | POA: Insufficient documentation

## 2022-11-29 DIAGNOSIS — R7989 Other specified abnormal findings of blood chemistry: Secondary | ICD-10-CM | POA: Insufficient documentation

## 2022-11-29 LAB — BRAIN NATRIURETIC PEPTIDE: B Natriuretic Peptide: 151.5 pg/mL — ABNORMAL HIGH (ref 0.0–100.0)

## 2023-08-11 IMAGING — MR MR HEAD WO/W CM
16 series · 47 of 48 positions shown · IV contrast (gadavist)
Comparison: Head CT 12/18/2020

CLINICAL DATA: Dizziness, abnormal prior head CT

EXAM:
MRI HEAD WITHOUT AND WITH CONTRAST
TECHNIQUE: Multiplanar, multiecho pulse sequences of the brain and surrounding
structures were obtained without and with intravenous contrast.
CONTRAST:  7mL GADAVIST GADOBUTROL 1 MMOL/ML IV SOLN

[Series 5: ax dwi_tracew · axial · 3.0mm · 0.65mm/px · z∈[-65,+87]mm · 3 of 48 slices shown]
[im 1/48]
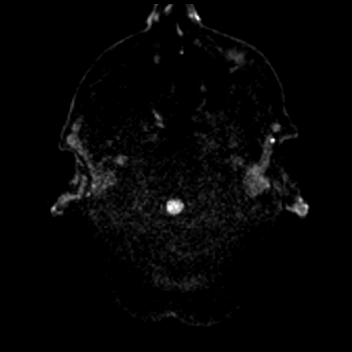
[im 24/48]
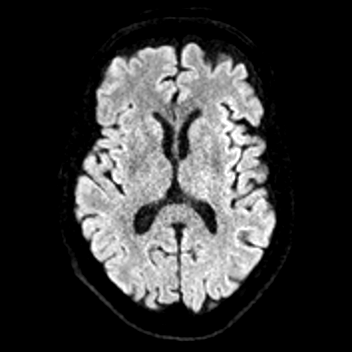
[im 48/48]
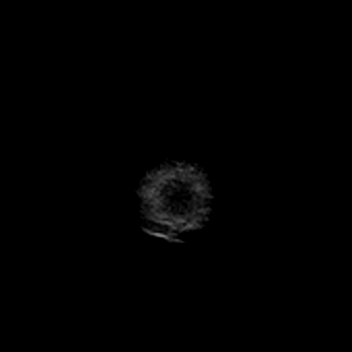

[Series 6: ax dwi_adc · axial · 3.0mm · 0.65mm/px · z∈[-65,+87]mm · 2 of 48 slices shown]
[im 1/48]
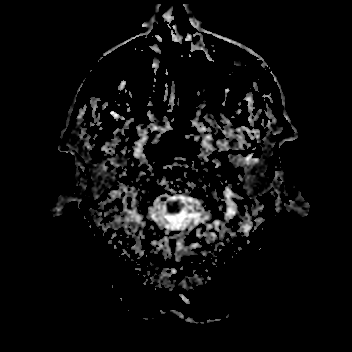
[im 48/48]
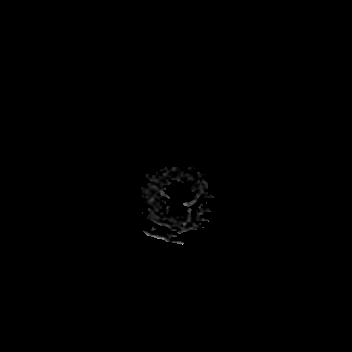

[Series 7: cor dwi_tracew · coronal · 5.0mm · 0.60mm/px · 2 of 38 slices shown]
[im 1/38]
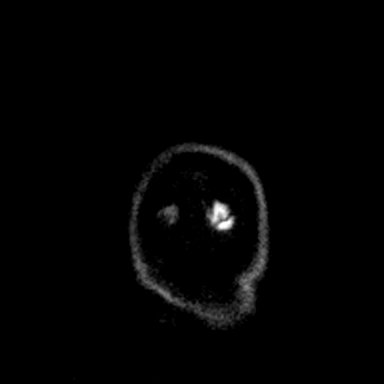
[im 38/38]
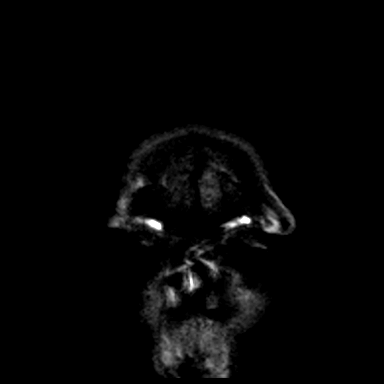

[Series 8: cor dwi_adc · coronal · 5.0mm · 0.60mm/px · 2 of 38 slices shown]
[im 1/38]
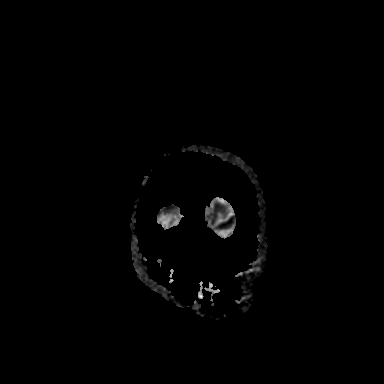
[im 38/38]
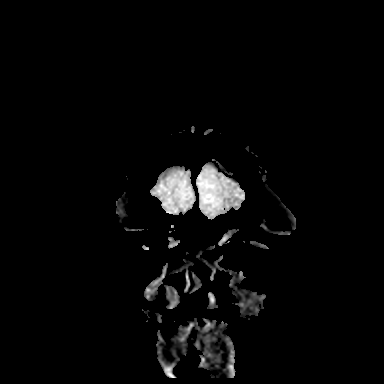

[Series 9: T1 · sagittal · 5.0mm · 0.62mm/px · 1 of 25 slices shown (1 of 3)]
[im 1/25]
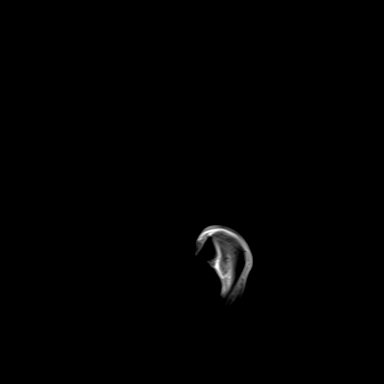

[Series 10: T2 · axial · 5.0mm · 0.53mm/px · 1 of 25 slices shown]
[im 1/25]
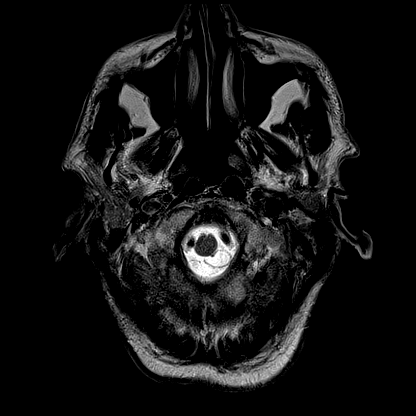

[Series 11: mag_images · axial · 3.0mm · 0.90mm/px · z∈[-76,+99]mm · 3 of 60 slices shown]
[im 1/60]
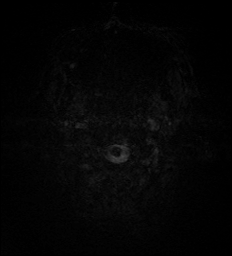
[im 30/60]
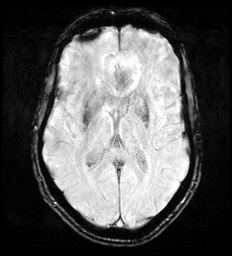
[im 60/60]
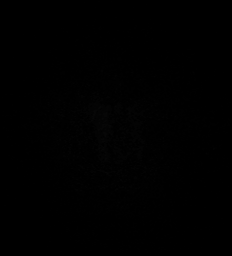

[Series 12: pha_images · axial · 3.0mm · 0.90mm/px · z∈[-76,+99]mm · 3 of 60 slices shown]
[im 1/60]
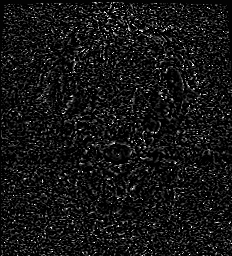
[im 30/60]
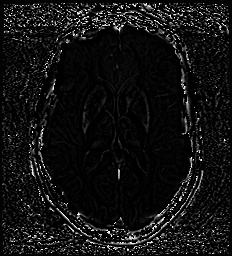
[im 60/60]
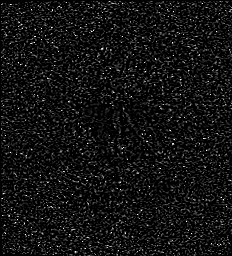

[Series 13: swi_images · axial · 3.0mm · 0.90mm/px · z∈[-76,+99]mm · 3 of 60 slices shown]
[im 1/60]
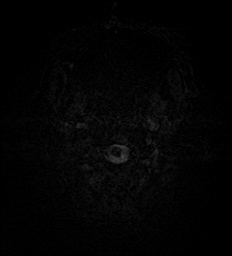
[im 30/60]
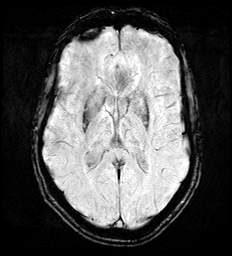
[im 60/60]
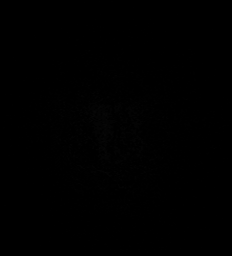

[Series 14: mip_images(sw) · axial · 24.0mm · 0.90mm/px · z∈[-65,+88]mm · 3 of 53 slices shown]
[im 1/53]
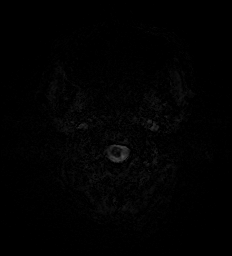
[im 27/53]
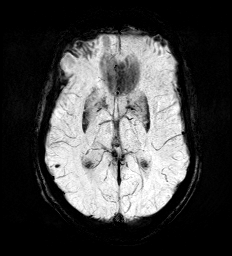
[im 53/53]
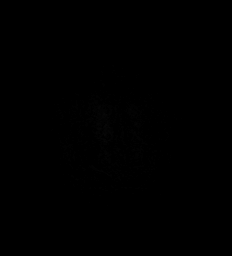

[Series 15: FLAIR · axial · 3.0mm · 0.53mm/px · z∈[-69,+90]mm · 3 of 55 slices shown]
[im 1/55]
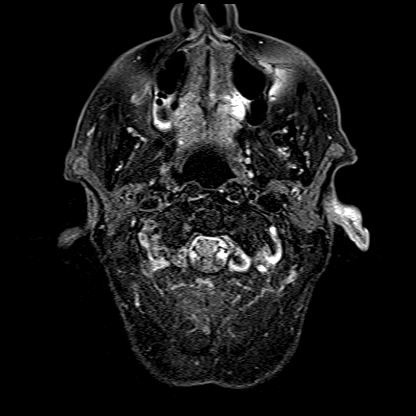
[im 28/55]
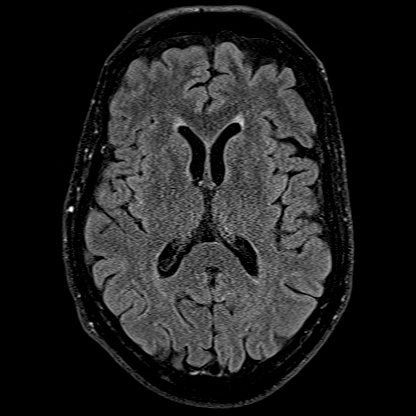
[im 55/55]
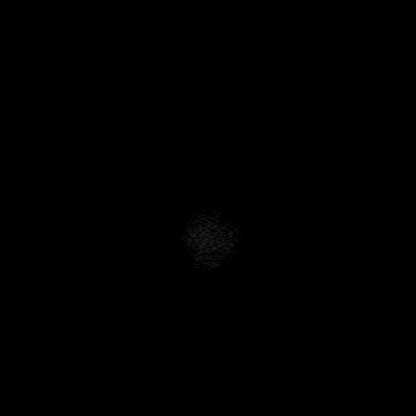

[Series 16: T1 · axial · 1.0mm · 0.98mm/px · z∈[-68,+103]mm · 8 of 176 slices shown (2 of 3)]
[im 1/176]
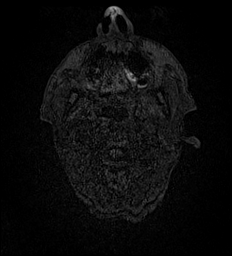
[im 22/176]
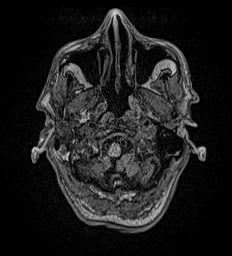
[im 44/176]
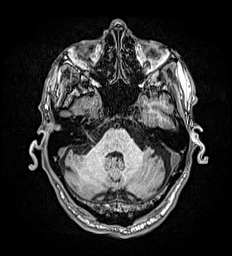
[im 66/176]
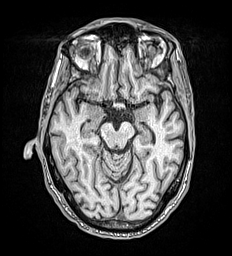
[im 110/176]
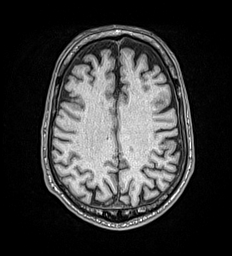
[im 132/176]
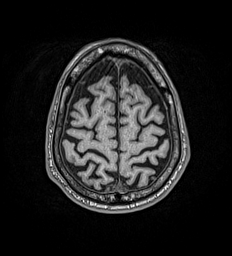
[im 154/176]
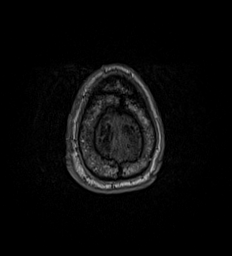
[im 176/176]
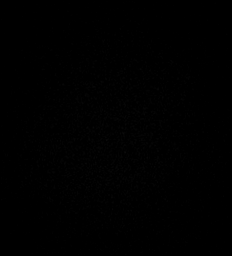

[Series 17: T2 post-contrast · coronal · 5.0mm · 0.57mm/px · 1 of 29 slices shown]
[im 1/29]
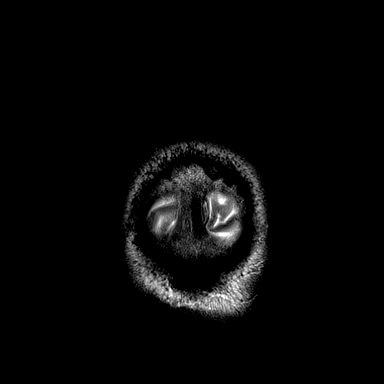

[Series 18: T1 post-contrast · axial · 1.0mm · 0.98mm/px · z∈[-68,+103]mm · 9 of 176 slices shown (1 of 2)]
[im 1/176]
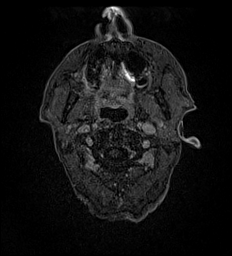
[im 22/176]
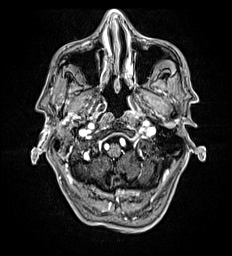
[im 44/176]
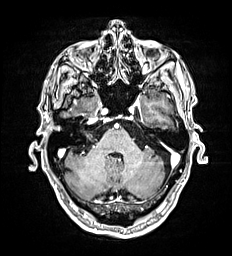
[im 66/176]
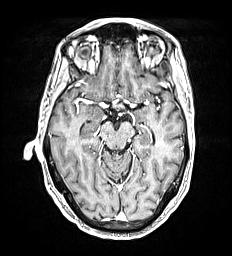
[im 88/176]
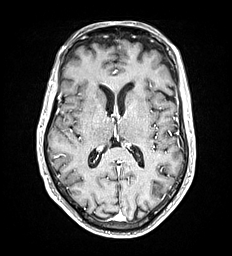
[im 110/176]
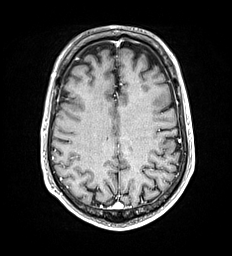
[im 132/176]
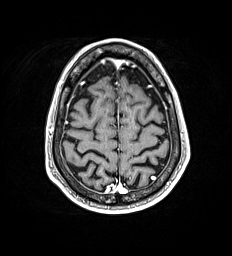
[im 154/176]
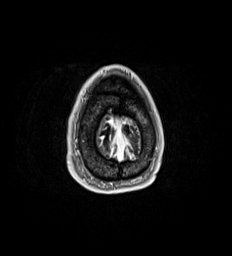
[im 176/176]
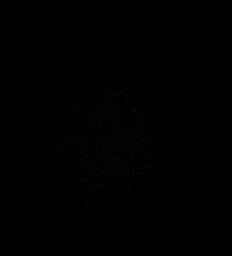

[Series 19: T1 · sagittal · 5.0mm · 0.47mm/px · 1 of 24 slices shown (3 of 3)]
[im 1/24]
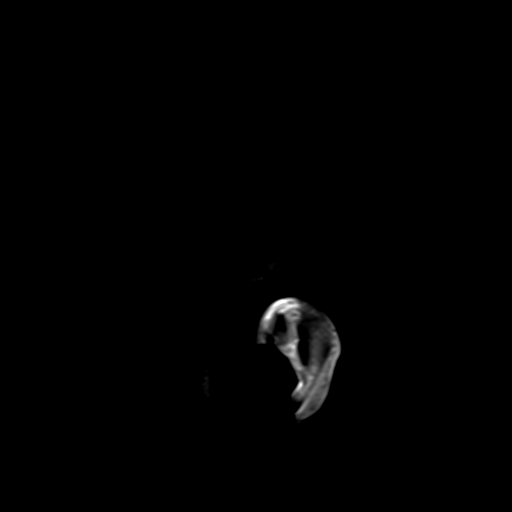

[Series 20: T1 post-contrast · coronal · 5.0mm · 0.43mm/px · 2 of 30 slices shown (2 of 2)]
[im 1/30]
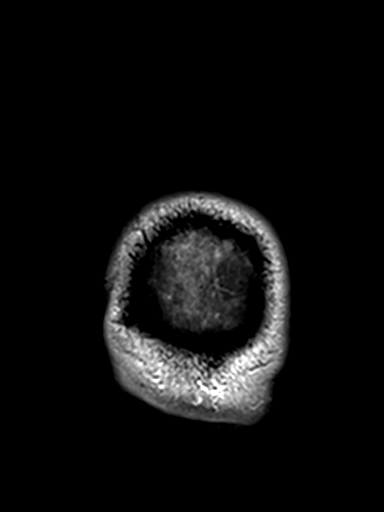
[im 30/30]
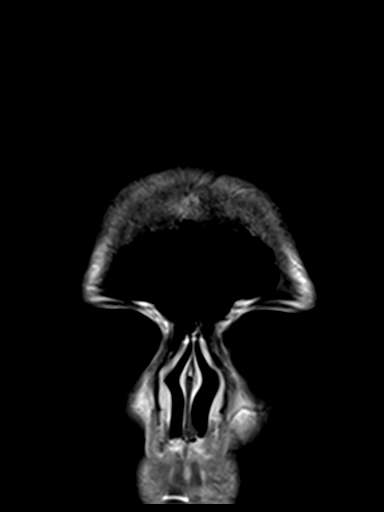

[47 of 48 positions shown; findings below may reference images not displayed]

FINDINGS: Brain: There is no acute intracranial hemorrhage, extra-axial fluid
collection, or acute infarct.

There are scattered foci of SWI signal dropout in the parenchyma,
some of which correspond to parenchymal calcifications seen on the
prior head CT. The signal dropout adjacent to the right lateral
ventricle corresponding to the questionable focus on the prior CT is
consistent with calcification based on the Phase images. The focus
of SWI signal dropout in the right parietal lobe which does not
correspond to a focus of calcification on the prior CT is consistent
with a punctate chronic microhemorrhage, nonspecific.

There is mild parenchymal volume loss and a minimal burden of white
matter signal abnormality. There is no suspicious parenchymal signal
abnormality.

There is no mass lesion. There is no abnormal enhancement. There is
no midline shift.

Vascular: Normal flow voids.

Skull and upper cervical spine: Normal marrow signal.

Sinuses/Orbits: The imaged paranasal sinuses are clear. Bilateral
lens implants are in place. The globes and orbits are otherwise
unremarkable.

Other: None.
IMPRESSION: 1. No acute intracranial hemorrhage, extra-axial fluid collection,
or infarct.
2. Small foci of SWI signal dropout corresponding to hyperdensity
seen on the prior CT are consistent with nonspecific parenchymal
calcifications.
3. Single punctate chronic microhemorrhage in the right parietal
lobe, nonspecific.
